# Patient Record
Sex: Female | Born: 1970 | Race: White | Hispanic: No | Marital: Married | Smoking: Never smoker
Health system: Southern US, Community
[De-identification: ages and names within clinical notes are randomized; demographics above are authoritative.]

## PROBLEM LIST (undated history)

## (undated) DIAGNOSIS — T4145XA Adverse effect of unspecified anesthetic, initial encounter: Secondary | ICD-10-CM

## (undated) DIAGNOSIS — J302 Other seasonal allergic rhinitis: Secondary | ICD-10-CM

## (undated) DIAGNOSIS — R51 Headache: Secondary | ICD-10-CM

## (undated) DIAGNOSIS — R519 Headache, unspecified: Secondary | ICD-10-CM

## (undated) DIAGNOSIS — Z87442 Personal history of urinary calculi: Secondary | ICD-10-CM

## (undated) DIAGNOSIS — R112 Nausea with vomiting, unspecified: Secondary | ICD-10-CM

## (undated) DIAGNOSIS — Z9889 Other specified postprocedural states: Secondary | ICD-10-CM

## (undated) DIAGNOSIS — I10 Essential (primary) hypertension: Secondary | ICD-10-CM

## (undated) DIAGNOSIS — T8859XA Other complications of anesthesia, initial encounter: Secondary | ICD-10-CM

---

## 2006-11-27 HISTORY — PX: ANKLE RECONSTRUCTION: SHX1151

## 2008-11-27 HISTORY — PX: TONSILLECTOMY: SUR1361

## 2011-11-28 HISTORY — PX: OTHER SURGICAL HISTORY: SHX169

## 2012-11-27 HISTORY — PX: KNEE ARTHROSCOPY: SUR90

## 2015-01-15 ENCOUNTER — Encounter (HOSPITAL_COMMUNITY): Payer: Self-pay | Admitting: Emergency Medicine

## 2015-01-15 ENCOUNTER — Emergency Department (HOSPITAL_COMMUNITY): Payer: BLUE CROSS/BLUE SHIELD

## 2015-01-15 ENCOUNTER — Emergency Department (HOSPITAL_COMMUNITY)
Admission: EM | Admit: 2015-01-15 | Discharge: 2015-01-16 | Disposition: A | Discharge status: Home / Self Care | Payer: BLUE CROSS/BLUE SHIELD | Attending: Emergency Medicine | Admitting: Emergency Medicine

## 2015-01-15 DIAGNOSIS — Z793 Long term (current) use of hormonal contraceptives: Secondary | ICD-10-CM | POA: Insufficient documentation

## 2015-01-15 DIAGNOSIS — N200 Calculus of kidney: Secondary | ICD-10-CM | POA: Diagnosis not present

## 2015-01-15 DIAGNOSIS — I10 Essential (primary) hypertension: Secondary | ICD-10-CM | POA: Insufficient documentation

## 2015-01-15 DIAGNOSIS — Z8742 Personal history of other diseases of the female genital tract: Secondary | ICD-10-CM | POA: Insufficient documentation

## 2015-01-15 DIAGNOSIS — Z79899 Other long term (current) drug therapy: Secondary | ICD-10-CM | POA: Insufficient documentation

## 2015-01-15 DIAGNOSIS — R109 Unspecified abdominal pain: Secondary | ICD-10-CM | POA: Diagnosis present

## 2015-01-15 HISTORY — DX: Essential (primary) hypertension: I10

## 2015-01-15 HISTORY — DX: Other seasonal allergic rhinitis: J30.2

## 2015-01-15 LAB — LIPASE, BLOOD: LIPASE: 31 U/L (ref 11–59)

## 2015-01-15 LAB — COMPREHENSIVE METABOLIC PANEL
ALK PHOS: 57 U/L (ref 39–117)
ALT: 12 U/L (ref 0–35)
ANION GAP: 13 (ref 5–15)
AST: 18 U/L (ref 0–37)
Albumin: 3.6 g/dL (ref 3.5–5.2)
BUN: 8 mg/dL (ref 6–23)
CO2: 21 mmol/L (ref 19–32)
Calcium: 9.3 mg/dL (ref 8.4–10.5)
Chloride: 102 mmol/L (ref 96–112)
Creatinine, Ser: 0.96 mg/dL (ref 0.50–1.10)
GFR calc Af Amer: 84 mL/min — ABNORMAL LOW (ref >90)
GFR calc non Af Amer: 72 mL/min — ABNORMAL LOW (ref >90)
Glucose, Bld: 132 mg/dL — ABNORMAL HIGH (ref 70–99)
POTASSIUM: 3.8 mmol/L (ref 3.5–5.1)
SODIUM: 136 mmol/L (ref 135–145)
TOTAL PROTEIN: 7.4 g/dL (ref 6.0–8.3)
Total Bilirubin: 0.2 mg/dL — ABNORMAL LOW (ref 0.3–1.2)

## 2015-01-15 LAB — URINALYSIS, ROUTINE W REFLEX MICROSCOPIC
Bilirubin Urine: NEGATIVE
Glucose, UA: NEGATIVE mg/dL
Ketones, ur: 15 mg/dL — AB
LEUKOCYTES UA: NEGATIVE
NITRITE: NEGATIVE
PROTEIN: NEGATIVE mg/dL
Specific Gravity, Urine: 1.024 (ref 1.005–1.030)
UROBILINOGEN UA: 0.2 mg/dL (ref 0.0–1.0)
pH: 5 (ref 5.0–8.0)

## 2015-01-15 LAB — CBC WITH DIFFERENTIAL/PLATELET
BASOS ABS: 0 10*3/uL (ref 0.0–0.1)
Basophils Relative: 0 % (ref 0–1)
Eosinophils Absolute: 0.2 10*3/uL (ref 0.0–0.7)
Eosinophils Relative: 2 % (ref 0–5)
HCT: 40.5 % (ref 36.0–46.0)
HEMOGLOBIN: 13.7 g/dL (ref 12.0–15.0)
Lymphocytes Relative: 25 % (ref 12–46)
Lymphs Abs: 2.4 10*3/uL (ref 0.7–4.0)
MCH: 28.6 pg (ref 26.0–34.0)
MCHC: 33.8 g/dL (ref 30.0–36.0)
MCV: 84.6 fL (ref 78.0–100.0)
Monocytes Absolute: 0.5 10*3/uL (ref 0.1–1.0)
Monocytes Relative: 5 % (ref 3–12)
NEUTROS ABS: 6.5 10*3/uL (ref 1.7–7.7)
NEUTROS PCT: 68 % (ref 43–77)
Platelets: 303 10*3/uL (ref 150–400)
RBC: 4.79 MIL/uL (ref 3.87–5.11)
RDW: 12.7 % (ref 11.5–15.5)
WBC: 9.6 10*3/uL (ref 4.0–10.5)

## 2015-01-15 LAB — URINE MICROSCOPIC-ADD ON

## 2015-01-15 LAB — PREGNANCY, URINE: PREG TEST UR: NEGATIVE

## 2015-01-15 LAB — POC URINE PREG, ED: PREG TEST UR: NEGATIVE

## 2015-01-15 MED ORDER — SODIUM CHLORIDE 0.9 % IV SOLN
Freq: Once | INTRAVENOUS | Status: AC
  Administered 2015-01-15: 21:00:00 via INTRAVENOUS

## 2015-01-15 MED ORDER — HYDROMORPHONE HCL 1 MG/ML IJ SOLN
1.0000 mg | Freq: Once | INTRAMUSCULAR | Status: AC
  Administered 2015-01-15: 1 mg via INTRAVENOUS
  Filled 2015-01-15: qty 1

## 2015-01-15 MED ORDER — OXYCODONE-ACETAMINOPHEN 5-325 MG PO TABS: 2.0000 | ORAL_TABLET | ORAL | Status: DC | PRN

## 2015-01-15 MED ORDER — KETOROLAC TROMETHAMINE 30 MG/ML IJ SOLN
30.0000 mg | Freq: Once | INTRAMUSCULAR | Status: AC
  Administered 2015-01-15: 30 mg via INTRAVENOUS
  Filled 2015-01-15: qty 1

## 2015-01-15 MED ORDER — LORAZEPAM 2 MG/ML IJ SOLN
1.0000 mg | Freq: Once | INTRAMUSCULAR | Status: AC
  Administered 2015-01-15: 1 mg via INTRAVENOUS
  Filled 2015-01-15: qty 1

## 2015-01-15 MED ORDER — ONDANSETRON HCL 4 MG/2ML IJ SOLN
4.0000 mg | Freq: Once | INTRAMUSCULAR | Status: AC
  Administered 2015-01-15: 4 mg via INTRAVENOUS
  Filled 2015-01-15: qty 2

## 2015-01-15 MED ORDER — IOHEXOL 300 MG/ML  SOLN
25.0000 mL | Freq: Once | INTRAMUSCULAR | Status: AC | PRN
  Administered 2015-01-15: 25 mL via ORAL

## 2015-01-15 MED ORDER — MORPHINE SULFATE 4 MG/ML IJ SOLN
4.0000 mg | Freq: Once | INTRAMUSCULAR | Status: AC
Start: 2015-01-15 — End: 2015-01-15
  Administered 2015-01-15: 4 mg via INTRAVENOUS
  Filled 2015-01-15: qty 1

## 2015-01-15 NOTE — ED Notes (Addendum)
C/o severe RLQ pain/side pain since 6:45pm.  Denies nausea, vomiting, diarrhea.  C/o dysuria. Last BM this morning- WNL.

## 2015-01-15 NOTE — ED Notes (Signed)
Pt c/o low back pain radiating into Right quadrant starting at 1900. Reports eating dinner then felt a sharp, sudden shooting pain in right flank. Pt diaphoretic.

## 2015-01-15 NOTE — ED Provider Notes (Signed)
CSN: [id number]     Arrival date & time 01/15/15  2013 History   First MD Initiated Contact with Patient 01/15/15 2034     Chief Complaint  Patient presents with  . Abdominal Pain     (Consider location/radiation/quality/duration/timing/severity/associated sxs/prior Treatment) HPI Comments: Patient is a 44 year old female with a past medical history of hypertension and seasonal allergies who presents with abdominal for the past 2 hours. The pain is located in the RUQ and right mid back and does not radiate. The pain is described as aching and severe. The pain started gradually and progressively worsened since the onset. No alleviating/aggravating factors. The patient has tried nothing for symptoms without relief. Associated symptoms include nothing. Patient denies fever, headache, NVD, chest pain, SOB, dysuria, constipation, abnormal vaginal bleeding/discharge. Patient reports history of ovarian cyst removal but no other abdominal surgical history.      Past Medical History  Diagnosis Date  . Hypertension   . Seasonal allergies    History reviewed. No pertinent past surgical history. No family history on file. History  Substance Use Topics  . Smoking status: Never Smoker   . Smokeless tobacco: Not on file  . Alcohol Use: No   OB History    No data available     Review of Systems  Constitutional: Negative for fever, chills and fatigue.  HENT: Negative for trouble swallowing.   Eyes: Negative for visual disturbance.  Respiratory: Negative for shortness of breath.   Cardiovascular: Negative for chest pain and palpitations.  Gastrointestinal: Positive for abdominal pain. Negative for nausea, vomiting and diarrhea.  Genitourinary: Negative for dysuria and difficulty urinating.  Musculoskeletal: Negative for arthralgias and neck pain.  Skin: Negative for color change.  Neurological: Negative for dizziness and weakness.  Psychiatric/Behavioral: Negative for dysphoric mood.       Allergies  Naproxen  Home Medications   Prior to Admission medications   Medication Sig Start Date End Date Taking? Authorizing Provider  cetirizine (ZYRTEC) 10 MG tablet Take 10 mg by mouth daily.   Yes Historical Provider, MD  Levonorgestrel-Ethinyl Estradiol (AMETHIA,CAMRESE) 0.15-0.03 &0.01 MG tablet Take 1 tablet by mouth daily.   Yes Historical Provider, MD  lisinopril (PRINIVIL,ZESTRIL) 5 MG tablet Take 5 mg by mouth daily.   Yes Historical Provider, MD  zolpidem (AMBIEN) 10 MG tablet Take 10 mg by mouth at bedtime as needed for sleep.   Yes Historical Provider, MD   BP 124/56 mmHg  Pulse 88  Temp(Src) 98.4 F (36.9 C) (Oral)  Resp 14  SpO2 97%  LMP 01/13/2015 Physical Exam  Constitutional: She is oriented to person, place, and time. She appears well-developed and well-nourished. No distress.  HENT:  Head: Normocephalic and atraumatic.  Eyes: Conjunctivae and EOM are normal.  Neck: Normal range of motion.  Cardiovascular: Normal rate and regular rhythm.  Exam reveals no gallop and no friction rub.   No murmur heard. Pulmonary/Chest: Effort normal and breath sounds normal. She has no wheezes. She has no rales. She exhibits no tenderness.  Abdominal: Soft. She exhibits no distension. There is tenderness. There is no rebound.  Obese abdomen. RUQ tenderness to palpation. No other focal tenderness or peritoneal signs.   Musculoskeletal: Normal range of motion.  Neurological: She is alert and oriented to person, place, and time. Coordination normal.  Speech is goal-oriented. Moves limbs without ataxia.   Skin: Skin is warm and dry.  Psychiatric: She has a normal mood and affect. Her behavior is normal.  Nursing note  and vitals reviewed.   ED Course  Procedures (including critical care time) Labs Review Labs Reviewed  COMPREHENSIVE METABOLIC PANEL - Abnormal; Notable for the following:    Glucose, Bld 132 (*)    Total Bilirubin 0.2 (*)    GFR calc non Af Amer 72  (*)    GFR calc Af Amer 84 (*)    All other components within normal limits  URINALYSIS, ROUTINE W REFLEX MICROSCOPIC - Abnormal; Notable for the following:    Hgb urine dipstick MODERATE (*)    Ketones, ur 15 (*)    All other components within normal limits  CBC WITH DIFFERENTIAL/PLATELET  LIPASE, BLOOD  PREGNANCY, URINE  URINE MICROSCOPIC-ADD ON  POC URINE PREG, ED    Imaging Review Ct Abdomen Pelvis Wo Contrast  01/15/2015   CLINICAL DATA:  Pt states Rt Flank pain -hx stones, -hematuria C/o severe RLQ pain/side pain since 6:45pm. Denies nausea, vomiting, diarrhea. C/o dysuria. Last BM this morning- WNL  EXAM: CT ABDOMEN AND PELVIS WITHOUT CONTRAST  TECHNIQUE: Multidetector CT imaging of the abdomen and pelvis was performed following the standard protocol without IV contrast.  COMPARISON:  None.  FINDINGS: Right kidney is edematous with mild right hydronephrosis and mild perinephric stranding. Right ureter is mildly dilated distally to where it intersects a 2-3 mm stone, 1 cm above the ureterovesicular junction.  No intrarenal stones. No renal masses. Left renal collecting system ureter are normal. No bladder mass or stone.  Lung bases essentially clear.  Heart normal in size.  Liver, spleen, gallbladder, pancreas, adrenal glands:  Normal.  Normal uterus and adnexa.  Mildly enlarged right inguinal lymph nodes, largest just deep to the common femoral vessels measuring 13 mm in short axis. No other adenopathy. No ascites.  There are few sigmoid colon diverticula. No diverticulitis. Remainder of the colon is unremarkable. Normal small bowel. Normal appendix.  Mild degenerative changes of the lower lumbar spine. No osteoblastic or osteolytic lesions.  IMPRESSION: 1. 2-3 mm stone in the distal right ureter causes mild right hydroureteronephrosis. 2. No other acute findings. 3. No intrarenal stones.   Electronically Signed   By: Amie Portlandavid  Ormond M.D.   On: 01/15/2015 23:31     EKG Interpretation None       MDM   Final diagnoses:  Kidney stone on right side    9:14 PM Labs and urinalysis pending. Vitals stable and patient afebrile. Patient will have morphine and zofran for symptoms.   11:44 PM Labs unremarkable for acute changes. Urinalysis shows moderate hemoglobin. CT shows 2-3 mm kidney stone in the distal ureter. Patient reports improvement of symptoms. Patient will have pain medication and be discharged with PCP follow up. Vitals stable and patient afebrile.   Emilia BeckKaitlyn Augustina Braddock, PA-C 01/15/15 2348  Mirian MoMatthew Gentry, MD 01/17/15 [phone]

## 2015-01-15 NOTE — Discharge Instructions (Signed)
Take Percocet as needed for pain. Refer to attached documents for more information. Return to the ED with worsening or concerning symptoms.  °

## 2015-01-26 ENCOUNTER — Other Ambulatory Visit: Payer: Self-pay | Admitting: Urology

## 2015-01-26 ENCOUNTER — Encounter (HOSPITAL_COMMUNITY): Payer: Self-pay | Admitting: *Deleted

## 2015-01-26 DIAGNOSIS — Z87442 Personal history of urinary calculi: Secondary | ICD-10-CM

## 2015-01-26 HISTORY — DX: Personal history of urinary calculi: Z87.442

## 2015-01-28 ENCOUNTER — Ambulatory Visit (HOSPITAL_COMMUNITY): Payer: BLUE CROSS/BLUE SHIELD

## 2015-01-28 ENCOUNTER — Encounter (HOSPITAL_COMMUNITY)
Admission: RE | Disposition: A | Discharge status: Home / Self Care | Payer: Self-pay | Source: Ambulatory Visit | Attending: Urology

## 2015-01-28 ENCOUNTER — Ambulatory Visit (HOSPITAL_COMMUNITY)
Admission: RE | Admit: 2015-01-28 | Discharge: 2015-01-28 | Disposition: A | Discharge status: Home / Self Care | Payer: BLUE CROSS/BLUE SHIELD | Source: Ambulatory Visit | Attending: Urology | Admitting: Urology

## 2015-01-28 ENCOUNTER — Encounter (HOSPITAL_COMMUNITY): Payer: Self-pay | Admitting: *Deleted

## 2015-01-28 DIAGNOSIS — I10 Essential (primary) hypertension: Secondary | ICD-10-CM | POA: Diagnosis not present

## 2015-01-28 DIAGNOSIS — N201 Calculus of ureter: Secondary | ICD-10-CM | POA: Diagnosis not present

## 2015-01-28 DIAGNOSIS — Z886 Allergy status to analgesic agent status: Secondary | ICD-10-CM | POA: Insufficient documentation

## 2015-01-28 HISTORY — DX: Personal history of urinary calculi: Z87.442

## 2015-01-28 HISTORY — DX: Adverse effect of unspecified anesthetic, initial encounter: T41.45XA

## 2015-01-28 HISTORY — DX: Headache, unspecified: R51.9

## 2015-01-28 HISTORY — DX: Other complications of anesthesia, initial encounter: T88.59XA

## 2015-01-28 HISTORY — DX: Nausea with vomiting, unspecified: Z98.890

## 2015-01-28 HISTORY — DX: Headache: R51

## 2015-01-28 HISTORY — DX: Nausea with vomiting, unspecified: R11.2

## 2015-01-28 LAB — PREGNANCY, URINE: Preg Test, Ur: NEGATIVE

## 2015-01-28 SURGERY — LITHOTRIPSY, ESWL
Anesthesia: LOCAL | Laterality: Right

## 2015-01-28 MED ORDER — GENTAMICIN SULFATE 40 MG/ML IJ SOLN
400.0000 mg | Freq: Once | INTRAMUSCULAR | Status: AC
  Administered 2015-01-28: 400 mg via INTRAVENOUS
  Filled 2015-01-28: qty 10

## 2015-01-28 MED ORDER — GENTAMICIN IN SALINE 1.6-0.9 MG/ML-% IV SOLN: 80.0000 mg | INTRAVENOUS | Status: DC

## 2015-01-28 MED ORDER — DIAZEPAM 5 MG PO TABS
10.0000 mg | ORAL_TABLET | ORAL | Status: AC
  Administered 2015-01-28: 10 mg via ORAL
  Filled 2015-01-28: qty 2

## 2015-01-28 MED ORDER — SODIUM CHLORIDE 0.9 % IV SOLN
INTRAVENOUS | Status: DC
  Administered 2015-01-28: 12:00:00 via INTRAVENOUS

## 2015-01-28 MED ORDER — DIPHENHYDRAMINE HCL 25 MG PO CAPS
25.0000 mg | ORAL_CAPSULE | ORAL | Status: AC
  Administered 2015-01-28: 25 mg via ORAL
  Filled 2015-01-28: qty 1

## 2015-01-28 NOTE — H&P (Signed)
Nicole Pena is an 44 y.o. female.    Chief Complaint: Pre-Op Right Shockwave LIthotripsy  HPI:   1 - Rt Ureteral Stone - Rt 3mm distal ureteral stone with mod hydro by ER CT 12/2014. No additional stones. No infectious parameters by ER UA. Stone 300 HU, SSD 14cm, located at level of med femoral head just above pelvic brim on scout images. Cr <1. Given trial of medical therapy and reports no interval passage. Stone is visible by KUB.  PMH sig for oophorectomy, ortho . Her PCP is Delaine LameJanice Dickerson [place] in [place].   Today "Nicole Pena" is seen to proceed with shockwave lithotripsy. NO interva fevers or stone passage.    Past Medical History  Diagnosis Date  . Hypertension   . Seasonal allergies   . History of kidney stones 01/2015  . Headache     history of migraines  . Complication of anesthesia     blood pressure dropped during ankle surgery  . PONV (postoperative nausea and vomiting)     treated with zofran    Past Surgical History  Procedure Laterality Date  . Left ovary removed Left 2013  . Tonsillectomy  2010  . Knee arthroscopy Right 2014  . Ankle reconstruction Left 2008    History reviewed. No pertinent family history. Social History:  reports that she has never smoked. She does not have any smokeless tobacco history on file. She reports that she does not drink alcohol or use illicit drugs.  Allergies:  Allergies  Allergen Reactions  . Naproxen Other (See Comments)    hallucinations    Medications Prior to Admission  Medication Sig Dispense Refill  . cetirizine (ZYRTEC) 10 MG tablet Take 10 mg by mouth daily.    . Levonorgestrel-Ethinyl Estradiol (AMETHIA,CAMRESE) 0.15-0.03 &0.01 MG tablet Take 1 tablet by mouth daily.    Marland Kitchen. lisinopril (PRINIVIL,ZESTRIL) 5 MG tablet Take 5 mg by mouth at bedtime.     Marland Kitchen. oxyCODONE-acetaminophen (PERCOCET/ROXICET) 5-325 MG per tablet Take 2 tablets by mouth every 4 (four) hours as needed for moderate pain or severe pain. 10 tablet 0  .  zolpidem (AMBIEN) 10 MG tablet Take 10 mg by mouth at bedtime as needed for sleep.      Results for orders placed or performed during the hospital encounter of 01/28/15 (from the past 48 hour(s))  Pregnancy, urine     Status: None   Collection Time: 01/28/15 11:44 AM  Result Value Ref Range   Preg Test, Ur NEGATIVE NEGATIVE    Comment:        THE SENSITIVITY OF THIS METHODOLOGY IS >20 mIU/mL.    Dg Abd 1 View  01/28/2015   CLINICAL DATA:  Right lower quadrant tenderness. Distal right ureteral calculus  EXAM: ABDOMEN - 1 VIEW  COMPARISON:  CT abdomen and pelvis January 15, 2015; abdominal radiograph January 26, 2015  FINDINGS: There is a 1 mm calcification in the lower right pelvis, unchanged. A 1 mm calcification is also noted in the lower left pelvis, stable. There are no new calcifications. There is moderate stool throughout the colon. The bowel gas pattern is unremarkable.  IMPRESSION: Small pelvic calcifications are stable. No new calcifications. Stool in colon. Bowel gas pattern unremarkable.   Electronically Signed   By: Bretta BangWilliam  Woodruff III M.D.   On: 01/28/2015 12:15    Review of Systems  Constitutional: Negative.   HENT: Negative.   Eyes: Negative.   Respiratory: Negative.   Cardiovascular: Negative.   Gastrointestinal: Negative.  Genitourinary: Positive for dysuria and flank pain.  Skin: Negative.   Neurological: Negative.   Endo/Heme/Allergies: Negative.   Psychiatric/Behavioral: Negative.     Blood pressure 139/78, pulse 86, temperature 98.6 F (37 C), temperature source Oral, resp. rate 16, height  (1.676 m), weight 121.292 kg (267 lb 6.4 oz), last menstrual period 01/18/2015, SpO2 100 %. Physical Exam  Constitutional: She appears well-developed.  HENT:  Head: Normocephalic.  Eyes: Pupils are equal, round, and reactive to light.  Neck: Normal range of motion.  Cardiovascular: Normal rate.   Respiratory: Effort normal.  GI: Soft.  Genitourinary:  Mild Rt   CVAT  Musculoskeletal: Normal range of motion.  Neurological: She is alert.  Skin: Skin is warm.  Psychiatric: She has a normal mood and affect. Her behavior is normal. Judgment and thought content normal.     Assessment/Plan    1 - Rt Ureteral Stone -   We rediscussed shockwave lithotripsy in detail as well as my "rule of 9s" with stones <46mm, less than 900 HU, and skin to stone distance <9cm having approximately 90% treatment success with single session of treatment. We then readdressed how stones that are larger, more dense, and in patients with less favorable anatomy have incrementally decreased success rates. We rediscussed risks including, bleeding, infection, hematoma, loss of kidney, need for staged therapy, need for adjunctive therapy and requirement to refrain from any anticoagulants, anti-platelet or aspirin-like products peri-procedureally. After careful consideration, the patient has chosen to proceed today as planned.     Kylin Dubs 01/28/2015, 12:28 PM

## 2015-01-28 NOTE — Discharge Instructions (Signed)
1 - You may have urinary urgency (bladder spasms) , bloody urine on / off, and pass stone material for few days. This is normal.  2 - Call MD or go to ER for fever >102, severe pain / nausea / vomiting not relieved by medications, or acute change in medical status

## 2015-01-28 NOTE — Brief Op Note (Signed)
01/28/2015  2:55 PM  PATIENT:  Vernie AmmonsNicole L Carboni  44 y.o. female  PRE-OPERATIVE DIAGNOSIS:  RIGHT URETERAL STONE  POST-OPERATIVE DIAGNOSIS:  * No post-op diagnosis entered *  PROCEDURE:  Procedure(s): RIGHT EXTRACORPOREAL SHOCK WAVE LITHOTRIPSY (ESWL) (Right)  SURGEON:  Surgeon(s) and Role:    * Sebastian Acheheodore Wreatha Sturgeon, MD - Primary  PHYSICIAN ASSISTANT:   ASSISTANTS: none   ANESTHESIA:   MAC  EBL:     BLOOD ADMINISTERED:none  DRAINS: none   LOCAL MEDICATIONS USED:  NONE  SPECIMEN:  No Specimen  DISPOSITION OF SPECIMEN:  N/A  COUNTS:  YES  TOURNIQUET:  * No tourniquets in log *  DICTATION: .Note written in paper chart  PLAN OF CARE: Discharge to home after PACU  PATIENT DISPOSITION:  PACU - hemodynamically stable.   Delay start of Pharmacological VTE agent (>24hrs) due to surgical blood loss or risk of bleeding: not applicable

## 2015-07-01 ENCOUNTER — Emergency Department (HOSPITAL_COMMUNITY): Payer: BLUE CROSS/BLUE SHIELD

## 2015-07-01 ENCOUNTER — Encounter (HOSPITAL_COMMUNITY): Payer: Self-pay | Admitting: Emergency Medicine

## 2015-07-01 ENCOUNTER — Emergency Department (HOSPITAL_COMMUNITY)
Admission: EM | Admit: 2015-07-01 | Discharge: 2015-07-01 | Disposition: A | Discharge status: Home / Self Care | Payer: BLUE CROSS/BLUE SHIELD | Attending: Emergency Medicine | Admitting: Emergency Medicine

## 2015-07-01 DIAGNOSIS — R0602 Shortness of breath: Secondary | ICD-10-CM | POA: Diagnosis not present

## 2015-07-01 DIAGNOSIS — Z79899 Other long term (current) drug therapy: Secondary | ICD-10-CM | POA: Diagnosis not present

## 2015-07-01 DIAGNOSIS — R1012 Left upper quadrant pain: Secondary | ICD-10-CM | POA: Insufficient documentation

## 2015-07-01 DIAGNOSIS — Z87442 Personal history of urinary calculi: Secondary | ICD-10-CM | POA: Diagnosis not present

## 2015-07-01 DIAGNOSIS — Z79818 Long term (current) use of other agents affecting estrogen receptors and estrogen levels: Secondary | ICD-10-CM | POA: Insufficient documentation

## 2015-07-01 DIAGNOSIS — R079 Chest pain, unspecified: Secondary | ICD-10-CM | POA: Insufficient documentation

## 2015-07-01 DIAGNOSIS — I10 Essential (primary) hypertension: Secondary | ICD-10-CM | POA: Diagnosis not present

## 2015-07-01 LAB — CBC WITH DIFFERENTIAL/PLATELET
BASOS ABS: 0 10*3/uL (ref 0.0–0.1)
Basophils Relative: 0 % (ref 0–1)
Eosinophils Absolute: 0.2 10*3/uL (ref 0.0–0.7)
Eosinophils Relative: 3 % (ref 0–5)
HCT: 40.4 % (ref 36.0–46.0)
Hemoglobin: 13.4 g/dL (ref 12.0–15.0)
Lymphocytes Relative: 33 % (ref 12–46)
Lymphs Abs: 2.2 10*3/uL (ref 0.7–4.0)
MCH: 28.8 pg (ref 26.0–34.0)
MCHC: 33.2 g/dL (ref 30.0–36.0)
MCV: 86.9 fL (ref 78.0–100.0)
Monocytes Absolute: 0.4 10*3/uL (ref 0.1–1.0)
Monocytes Relative: 6 % (ref 3–12)
Neutro Abs: 3.9 10*3/uL (ref 1.7–7.7)
Neutrophils Relative %: 58 % (ref 43–77)
PLATELETS: 303 10*3/uL (ref 150–400)
RBC: 4.65 MIL/uL (ref 3.87–5.11)
RDW: 12.9 % (ref 11.5–15.5)
WBC: 6.7 10*3/uL (ref 4.0–10.5)

## 2015-07-01 LAB — URINALYSIS, ROUTINE W REFLEX MICROSCOPIC
Glucose, UA: NEGATIVE mg/dL
Hgb urine dipstick: NEGATIVE
KETONES UR: NEGATIVE mg/dL
NITRITE: NEGATIVE
Protein, ur: NEGATIVE mg/dL
Specific Gravity, Urine: 1.039 — ABNORMAL HIGH (ref 1.005–1.030)
Urobilinogen, UA: 1 mg/dL (ref 0.0–1.0)
pH: 6 (ref 5.0–8.0)

## 2015-07-01 LAB — URINE MICROSCOPIC-ADD ON

## 2015-07-01 LAB — BASIC METABOLIC PANEL
ANION GAP: 7 (ref 5–15)
BUN: 12 mg/dL (ref 6–20)
CHLORIDE: 107 mmol/L (ref 101–111)
CO2: 24 mmol/L (ref 22–32)
CREATININE: 0.91 mg/dL (ref 0.44–1.00)
Calcium: 8.5 mg/dL — ABNORMAL LOW (ref 8.9–10.3)
Glucose, Bld: 114 mg/dL — ABNORMAL HIGH (ref 65–99)
Potassium: 3.6 mmol/L (ref 3.5–5.1)
SODIUM: 138 mmol/L (ref 135–145)

## 2015-07-01 LAB — I-STAT TROPONIN, ED
TROPONIN I, POC: 0 ng/mL (ref 0.00–0.08)
Troponin i, poc: 0 ng/mL (ref 0.00–0.08)

## 2015-07-01 LAB — LIPASE, BLOOD: LIPASE: 30 U/L (ref 22–51)

## 2015-07-01 LAB — D-DIMER, QUANTITATIVE (NOT AT ARMC)

## 2015-07-01 MED ORDER — MORPHINE SULFATE 4 MG/ML IJ SOLN
4.0000 mg | Freq: Once | INTRAMUSCULAR | Status: AC
  Administered 2015-07-01: 4 mg via INTRAVENOUS
  Filled 2015-07-01: qty 1

## 2015-07-01 MED ORDER — SODIUM CHLORIDE 0.9 % IV BOLUS (SEPSIS)
1000.0000 mL | Freq: Once | INTRAVENOUS | Status: AC
  Administered 2015-07-01: 1000 mL via INTRAVENOUS

## 2015-07-01 MED ORDER — ASPIRIN 81 MG PO CHEW
324.0000 mg | CHEWABLE_TABLET | Freq: Once | ORAL | Status: AC
  Administered 2015-07-01: 324 mg via ORAL
  Filled 2015-07-01: qty 4

## 2015-07-01 MED ORDER — ONDANSETRON HCL 4 MG/2ML IJ SOLN
4.0000 mg | Freq: Once | INTRAMUSCULAR | Status: AC
  Administered 2015-07-01: 4 mg via INTRAVENOUS
  Filled 2015-07-01: qty 2

## 2015-07-01 MED ORDER — GI COCKTAIL ~~LOC~~
30.0000 mL | Freq: Once | ORAL | Status: AC
  Administered 2015-07-01: 30 mL via ORAL
  Filled 2015-07-01: qty 30

## 2015-07-01 NOTE — Discharge Instructions (Signed)
Chest Pain (Nonspecific) Follow up with your primary care physician.  Return for chest pain, shortness of breath, and diaphoresis.  It is often hard to give a diagnosis for the cause of chest pain. There is always a chance that your pain could be related to something serious, such as a heart attack or a blood clot in the lungs. You need to follow up with your doctor. HOME CARE  If antibiotic medicine was given, take it as directed by your doctor. Finish the medicine even if you start to feel better.  For the next few days, avoid activities that bring on chest pain. Continue physical activities as told by your doctor.  Do not use any tobacco products. This includes cigarettes, chewing tobacco, and e-cigarettes.  Avoid drinking alcohol.  Only take medicine as told by your doctor.  Follow your doctor's suggestions for more testing if your chest pain does not go away.  Keep all doctor visits you made. GET HELP IF:  Your chest pain does not go away, even after treatment.  You have a rash with blisters on your chest.  You have a fever. GET HELP RIGHT AWAY IF:   You have more pain or pain that spreads to your arm, neck, jaw, back, or belly (abdomen).  You have shortness of breath.  You cough more than usual or cough up blood.  You have very bad back or belly pain.  You feel sick to your stomach (nauseous) or throw up (vomit).  You have very bad weakness.  You pass out (faint).  You have chills. This is an emergency. Do not wait to see if the problems will go away. Call your local emergency services (911 in [place] Do not drive yourself to the hospital. MAKE SURE YOU:   Understand these instructions.  Will watch your condition.  Will get help right away if you are not doing well or get worse. Document Released: 05/01/2008 Document Revised: 11/18/2013 Document Reviewed: 05/01/2008 [facility] Patient Information 2015 [place], [place]. This information is not intended to replace  advice given to you by your health care provider. Make sure you discuss any questions you have with your health care provider.

## 2015-07-01 NOTE — ED Notes (Signed)
Pt comes in today with a c/o chest pain. Pt states that she has been having some abdominal pain and yesterday the pain radiated into her chest. Pt states the pain is on the left side and mid section of her chest. Pt states the pain radiates to her shoulders.

## 2015-07-01 NOTE — ED Provider Notes (Signed)
CSN: [id number]     Arrival date & time 07/01/15  1706 History   First MD Initiated Contact with Patient 07/01/15 1711     Chief Complaint  Patient presents with  . Chest Pain     (Consider location/radiation/quality/duration/timing/severity/associated sxs/prior Treatment) The history is provided by the patient. No language interpreter was used.   Nicole Pena is a 44 year old female with a history of hypertension and kidney stones who presents for left upper quadrant abdominal pain for the past 3 weeks and left sided chest pain that began this morning. She states it is intermittent and feels like a "vice grip." She reports mild shortness of breath with the chest pain. She denies any radiation of the pain. Nothing makes her symptoms better or worse. Her treatment prior to arrival. She is on birth control pills. Family history includes mother having a heart attack over the age of 75. No recent surgical history. No prior history of DVT, PE, or previous heart stents or MI. She denies any smoking history. She denies any fever, chills, cough, recent illness, vomiting, diarrhea, constipation, dysuria, hematuria, urinary frequency.  Past Medical History  Diagnosis Date  . Hypertension   . Seasonal allergies   . History of kidney stones 01/2015  . Headache     history of migraines  . Complication of anesthesia     blood pressure dropped during ankle surgery  . PONV (postoperative nausea and vomiting)     treated with zofran   Past Surgical History  Procedure Laterality Date  . Left ovary removed Left 2013  . Tonsillectomy  2010  . Knee arthroscopy Right 2014  . Ankle reconstruction Left 2008   History reviewed. No pertinent family history. History  Substance Use Topics  . Smoking status: Never Smoker   . Smokeless tobacco: Not on file  . Alcohol Use: No   OB History    No data available     Review of Systems  Respiratory: Positive for shortness of breath.   Cardiovascular: Positive  for chest pain. Negative for leg swelling.  All other systems reviewed and are negative.     Allergies  Naproxen and Statins  Home Medications   Prior to Admission medications   Medication Sig Start Date End Date Taking? Authorizing Provider  cetirizine (ZYRTEC) 10 MG tablet Take 10 mg by mouth daily.   Yes Historical Provider, MD  Levonorgestrel-Ethinyl Estradiol (AMETHIA,CAMRESE) 0.15-0.03 &0.01 MG tablet Take 1 tablet by mouth daily.   Yes Historical Provider, MD  LORazepam (ATIVAN) 0.5 MG tablet Take 0.25 mg by mouth at bedtime as needed. sleep 06/09/15  Yes Historical Provider, MD  losartan (COZAAR) 50 MG tablet Take 50 mg by mouth at bedtime.   Yes Historical Provider, MD  zolpidem (AMBIEN) 10 MG tablet Take 10 mg by mouth at bedtime as needed for sleep.   Yes Historical Provider, MD   BP 138/53 mmHg  Pulse 72  Temp(Src) 98.1 F (36.7 C) (Oral)  Resp 15  Ht 5\' 5"  (1.651 m)  Wt 265 lb (120.203 kg)  BMI 44.10 kg/m2  SpO2 97%  LMP 04/26/2014 Physical Exam  Constitutional: She is oriented to person, place, and time. She appears well-developed and well-nourished. No distress.  HENT:  Head: Normocephalic and atraumatic.  Eyes: Conjunctivae are normal.  Neck: Normal range of motion. Neck supple.  Cardiovascular: Normal rate, regular rhythm and normal heart sounds.   Pulmonary/Chest: Effort normal and breath sounds normal. No accessory muscle usage. No respiratory distress. She has  no decreased breath sounds. She has no wheezes. She has no rales. She exhibits no tenderness.  Abdominal: Soft. She exhibits no distension and no mass. There is tenderness in the left upper quadrant. There is no rigidity, no rebound, no guarding, no tenderness at McBurney's point and negative Murphy's sign.  Morbidly obese.  Musculoskeletal: Normal range of motion. She exhibits no edema or tenderness.  No calf tenderness to palpation.  Neurological: She is alert and oriented to person, place, and  time.  Skin: Skin is warm and dry.  Psychiatric: She has a normal mood and affect. Her behavior is normal.  Nursing note and vitals reviewed.   ED Course  Procedures (including critical care time) Labs Review Labs Reviewed  BASIC METABOLIC PANEL - Abnormal; Notable for the following:    Glucose, Bld 114 (*)    Calcium 8.5 (*)    All other components within normal limits  URINALYSIS, ROUTINE W REFLEX MICROSCOPIC (NOT AT [facility]) - Abnormal; Notable for the following:    Color, Urine AMBER (*)    APPearance CLOUDY (*)    Specific Gravity, Urine 1.039 (*)    Bilirubin Urine SMALL (*)    Leukocytes, UA SMALL (*)    All other components within normal limits  URINE MICROSCOPIC-ADD ON - Abnormal; Notable for the following:    Bacteria, UA MANY (*)    All other components within normal limits  CBC WITH DIFFERENTIAL/PLATELET  LIPASE, BLOOD  D-DIMER, QUANTITATIVE (NOT AT [facility])  I-STAT TROPOININ, ED  Rosezena Sensor, ED    Imaging Review Dg Chest 2 View  07/01/2015   CLINICAL DATA:  Lateral chest pain  EXAM: CHEST  2 VIEW  COMPARISON:  None.  FINDINGS: The heart size and mediastinal contours are within normal limits. Both lungs are clear. The visualized skeletal structures are unremarkable.  IMPRESSION: No active cardiopulmonary disease.   Electronically Signed   By: Christiana Pellant M.D.   On: 07/01/2015 18:23     EKG Interpretation   Date/Time:  Thursday July 01 2015 17:19:26 EDT Ventricular Rate:  106 PR Interval:  153 QRS Duration: 89 QT Interval:  333 QTC Calculation: 442 R Axis:   48 Text Interpretation:  Sinus tachycardia Confirmed by Fayrene Fearing  MD, MARK  [phone]) on 07/01/2015 5:21:17 PM      MDM   Final diagnoses:  Chest pain, unspecified chest pain type  LUQ abdominal pain   Patient presents for chest pain that began this morning. She is also complaining of LUQ abdominal pain 3 weeks. Her vitals are stable and not concerning. Her labs are unremarkable and troponin is  negative. Chest x-ray is negative for any edema, pneumonia, or pneumothorax. Her EKG is also not concerning. She has a heart score of 1. D-dimer is negative.  Recheck: Patient just received aspirin and Zofran. She states that she has a history of Lyme's disease and mono and felt similar symptoms at that time. Recheck: She states the chest cramping has resolved but continues to have abdominal pain.  Will give GI cocktail.  Repeat troponin is negative. I discussed return precautions with the patient as well as following up with her PCP. She verbally agrees with the plan and were no unanswered concerns.      Catha Gosselin, PA-C 07/01/15 2226  Rolland Porter, MD 07/06/15 [phone]

## 2015-07-02 ENCOUNTER — Encounter: Payer: Self-pay | Admitting: Gastroenterology

## 2015-07-07 ENCOUNTER — Ambulatory Visit (INDEPENDENT_AMBULATORY_CARE_PROVIDER_SITE_OTHER): Payer: BLUE CROSS/BLUE SHIELD | Admitting: Family Medicine

## 2015-07-07 ENCOUNTER — Encounter: Payer: Self-pay | Admitting: Family Medicine

## 2015-07-07 DIAGNOSIS — I10 Essential (primary) hypertension: Secondary | ICD-10-CM | POA: Insufficient documentation

## 2015-07-07 DIAGNOSIS — G4700 Insomnia, unspecified: Secondary | ICD-10-CM | POA: Insufficient documentation

## 2015-07-07 DIAGNOSIS — J302 Other seasonal allergic rhinitis: Secondary | ICD-10-CM | POA: Insufficient documentation

## 2015-07-07 DIAGNOSIS — R1011 Right upper quadrant pain: Secondary | ICD-10-CM | POA: Diagnosis not present

## 2015-07-07 DIAGNOSIS — N83299 Other ovarian cyst, unspecified side: Secondary | ICD-10-CM | POA: Insufficient documentation

## 2015-07-07 NOTE — Progress Notes (Signed)
BP 128/84 mmHg  Pulse 76  Temp(Src) 99.4 F (37.4 C) (Oral)  Ht  (1.651 m)  Wt 266 lb 6.4 oz (120.838 kg)  BMI 44.33 kg/m2  LMP 04/06/2015 (Approximate)   Subjective:    Patient ID: Nicole Pena, female    DOB: Feb 05, 1971, 44 y.o.   MRN: [id number]  HPI: Nicole Pena is a 44 y.o. female presenting on 07/07/2015 for Establish Care; Abdominal Pain; and Nausea   HPI Patient presents today to establish care but also here for abdominal pain and nausea. She has been having this abdominal pain/nausea for a week to 2 weeks. He came on gradually but is increased. She has noted that it's worse with foods especially fatty and fried foods but it is also constant. She went to the emergency department on 07/01/2015 native some tests including a lipase, basic metabolic panel, CBC, chest x-ray, EKG and urinalysis and these were all normal. She has since that time been on a bland diet which is improved symptoms but not resolved. Pain today is 6 out of 10 but has been as high as 10 out of 10 and previously. Pertinent history is that she has had renal stones previously and she has also had ovarian cysts but this pain is not like that pain. She feels sharp stabbing sensations like something is punching her in the gut.  Relevant past medical, surgical, family and social history reviewed and updated as indicated. Interim medical history since our last visit reviewed. Allergies and medications reviewed and updated.  Review of Systems  Constitutional: Negative for fever and chills.  HENT: Negative for congestion and sore throat.   Eyes: Negative for redness and visual disturbance.  Respiratory: Positive for chest tightness (had developed chest tightness when pain was severe but that has resolved). Negative for cough, shortness of breath and wheezing.   Cardiovascular: Positive for chest pain (had chest pain one pain was severe but that part is resolved). Negative for palpitations.  Gastrointestinal:  Positive for nausea and abdominal pain. Negative for vomiting, diarrhea, constipation, blood in stool and abdominal distention.  Genitourinary: Negative for dysuria, hematuria, vaginal bleeding, vaginal discharge, difficulty urinating, vaginal pain and pelvic pain.  Musculoskeletal: Positive for back pain (ain does radiate around to her right and left flank).  Skin: Negative for color change and rash.  Allergic/Immunologic: Positive for environmental allergies.    Per HPI unless specifically indicated above     Medication List       This list is accurate as of: 07/07/15 12:08 PM.  Always use your most recent med list.               cetirizine 10 MG tablet  Commonly known as:  ZYRTEC  Take 10 mg by mouth daily.     Levonorgestrel-Ethinyl Estradiol 0.15-0.03 &0.01 MG tablet  Commonly known as:  AMETHIA,CAMRESE  Take 1 tablet by mouth daily.     LORazepam 0.5 MG tablet  Commonly known as:  ATIVAN  Take 0.25 mg by mouth at bedtime as needed. sleep     losartan 50 MG tablet  Commonly known as:  COZAAR  Take 50 mg by mouth at bedtime.     zolpidem 10 MG tablet  Commonly known as:  AMBIEN  Take 10 mg by mouth at bedtime as needed for sleep.           Objective:    BP 128/84 mmHg  Pulse 76  Temp(Src) 99.4 F (37.4 C) (Oral)  Ht 5\' 5"  (1.651 m)  Wt 266 lb 6.4 oz (120.838 kg)  BMI 44.33 kg/m2  LMP 04/06/2015 (Approximate)  Wt Readings from Last 3 Encounters:  07/07/15 266 lb 6.4 oz (120.838 kg)  07/01/15 265 lb (120.203 kg)  01/28/15 267 lb 6.4 oz (121.292 kg)    Physical Exam  Constitutional: She is oriented to person, place, and time. She appears well-developed and well-nourished. No distress.  Obese  Eyes: Conjunctivae and EOM are normal. Pupils are equal, round, and reactive to light.  Cardiovascular: Normal rate and regular rhythm.   No murmur heard. Pulmonary/Chest: Effort normal and breath sounds normal. No respiratory distress. She has no wheezes.    Abdominal: Soft. Bowel sounds are normal. She exhibits no distension, no fluid wave and no ascites. There is no hepatosplenomegaly. There is tenderness (worse in epigastrium and right upper quadrant but does have some tenderness in left upper quadrant and left lower quadrant as well). There is no rebound, no guarding and no CVA tenderness.  Musculoskeletal: Normal range of motion. She exhibits no edema or tenderness.  Neurological: She is alert and oriented to person, place, and time. Coordination normal.  Skin: Skin is warm and dry. No rash noted. She is not diaphoretic.  Psychiatric: She has a normal mood and affect. Her behavior is normal.  Vitals reviewed.   Results for orders placed or performed during the hospital encounter of 07/01/15  CBC with Differential  Result Value Ref Range   WBC 6.7 4.0 - 10.5 K/uL   RBC 4.65 3.87 - 5.11 MIL/uL   Hemoglobin 13.4 12.0 - 15.0 g/dL   HCT 11.9 14.7 - 82.9 %   MCV 86.9 78.0 - 100.0 fL   MCH 28.8 26.0 - 34.0 pg   MCHC 33.2 30.0 - 36.0 g/dL   RDW 56.2 13.0 - 86.5 %   Platelets 303 150 - 400 K/uL   Neutrophils Relative % 58 43 - 77 %   Neutro Abs 3.9 1.7 - 7.7 K/uL   Lymphocytes Relative 33 12 - 46 %   Lymphs Abs 2.2 0.7 - 4.0 K/uL   Monocytes Relative 6 3 - 12 %   Monocytes Absolute 0.4 0.1 - 1.0 K/uL   Eosinophils Relative 3 0 - 5 %   Eosinophils Absolute 0.2 0.0 - 0.7 K/uL   Basophils Relative 0 0 - 1 %   Basophils Absolute 0.0 0.0 - 0.1 K/uL  Lipase, blood  Result Value Ref Range   Lipase 30 22 - 51 U/L  Basic metabolic panel  Result Value Ref Range   Sodium 138 135 - 145 mmol/L   Potassium 3.6 3.5 - 5.1 mmol/L   Chloride 107 101 - 111 mmol/L   CO2 24 22 - 32 mmol/L   Glucose, Bld 114 (H) 65 - 99 mg/dL   BUN 12 6 - 20 mg/dL   Creatinine, Ser 7.84 0.44 - 1.00 mg/dL   Calcium 8.5 (L) 8.9 - 10.3 mg/dL   GFR calc non Af Amer >60 >60 mL/min   GFR calc Af Amer >60 >60 mL/min   Anion gap 7 5 - 15  D-dimer, quantitative (not at  [facility])  Result Value Ref Range   D-Dimer, Quant <0.27 0.00 - 0.48 ug/mL-FEU  Urinalysis, Routine w reflex microscopic (not at [facility])  Result Value Ref Range   Color, Urine AMBER (A) YELLOW   APPearance CLOUDY (A) CLEAR   Specific Gravity, Urine 1.039 (H) 1.005 - 1.030   pH 6.0 5.0 - 8.0  Glucose, UA NEGATIVE NEGATIVE mg/dL   Hgb urine dipstick NEGATIVE NEGATIVE   Bilirubin Urine SMALL (A) NEGATIVE   Ketones, ur NEGATIVE NEGATIVE mg/dL   Protein, ur NEGATIVE NEGATIVE mg/dL   Urobilinogen, UA 1.0 0.0 - 1.0 mg/dL   Nitrite NEGATIVE NEGATIVE   Leukocytes, UA SMALL (A) NEGATIVE  Urine microscopic-add on  Result Value Ref Range   Squamous Epithelial / LPF RARE RARE   WBC, UA 0-2 <3 WBC/hpf   Bacteria, UA MANY (A) RARE   Urine-Other MUCOUS PRESENT   I-Stat Troponin, ED (not at [facility])  Result Value Ref Range   Troponin i, poc 0.00 0.00 - 0.08 ng/mL   Comment 3          I-Stat Troponin, ED (not at [facility])  Result Value Ref Range   Troponin i, poc 0.00 0.00 - 0.08 ng/mL   Comment 3              Assessment & Plan:   Problem List Items Addressed This Visit      Other   Abdominal pain, right upper quadrant    Pt here for new RUQ abd pain of 1-2 weeks. Worse with food, lipase at ED along with labs were normal. Will get RUQ U/s, do CT abd if it is negative. In ER patient also had other labs which were all normal including a UA and chem panel.      Relevant Orders   US Abdomen Limited RUQ       Follow up plan: Return in about 1 week (around 07/14/2015), or if symptoms worsen or fail to improve.  Arville Care, MD [facility] 07/07/2015, 12:08 PM

## 2015-07-07 NOTE — Patient Instructions (Signed)
Cholecystitis Cholecystitis is an inflammation of your gallbladder. It is usually caused by a buildup of gallstones or sludge (cholelithiasis) in your gallbladder. The gallbladder stores a fluid that helps digest fats (bile). Cholecystitis is serious and needs treatment right away.  CAUSES   Gallstones. Gallstones can block the tube that leads to your gallbladder, causing bile to build up. As bile builds up, the gallbladder becomes inflamed.  Bile duct problems, such as blockage from scarring or kinking.  Tumors. Tumors can stop bile from leaving your gallbladder correctly, causing bile to build up. As bile builds up, the gallbladder becomes inflamed. SYMPTOMS   Nausea.  Vomiting.  Abdominal pain, especially in the upper right area of your abdomen.  Abdominal tenderness or bloating.  Sweating.  Chills.  Fever.  Yellowing of the skin and the whites of the eyes (jaundice). DIAGNOSIS  Your caregiver may order blood tests to look for infection or gallbladder problems. Your caregiver may also order imaging tests, such as an ultrasound or computed tomography (CT) scan. Further tests may include a hepatobiliary iminodiacetic acid (HIDA) scan. This scan allows your caregiver to see your bile move from the liver to the gallbladder and to the small intestine. TREATMENT  A hospital stay is usually necessary to lessen the inflammation of your gallbladder. You may be required to not eat or drink (fast) for a certain amount of time. You may be given medicine to treat pain or an antibiotic medicine to treat an infection. Surgery may be needed to remove your gallbladder (cholecystectomy) once the inflammation has gone down. Surgery may be needed right away if you develop complications such as death of gallbladder tissue (gangrene) or a tear (perforation) of the gallbladder.  HOME CARE INSTRUCTIONS  Home care will depend on your treatment. In general:  If you were given antibiotics, take them as  directed. Finish them even if you start to feel better.  Only take over-the-counter or prescription medicines for pain, discomfort, or fever as directed by your caregiver.  Follow a low-fat diet until you see your caregiver again.  Keep all follow-up visits as directed by your caregiver. SEEK IMMEDIATE MEDICAL CARE IF:   Your pain is increasing and not controlled by medicines.  Your pain moves to another part of your abdomen or to your back.  You have a fever.  You have nausea and vomiting. MAKE SURE YOU:  Understand these instructions.  Will watch your condition.  Will get help right away if you are not doing well or get worse. Document Released: 11/13/2005 Document Revised: 02/05/2012 Document Reviewed: 09/29/2011 Exit[employer] Patient Information 2015 [employer]. This information is not intended to replace advice given to you by your health care provider. Make sure you discuss any questions you have with your health care provider.  

## 2015-07-07 NOTE — Assessment & Plan Note (Addendum)
Pt here for new RUQ abd pain of 1-2 weeks. Worse with food, lipase at ED along with labs were normal. Will get RUQ U/s, do CT abd if it is negative. In ER patient also had other labs which were all normal including a UA and chem panel.

## 2015-07-08 ENCOUNTER — Ambulatory Visit (HOSPITAL_COMMUNITY)
Admission: RE | Admit: 2015-07-08 | Discharge: 2015-07-08 | Disposition: A | Discharge status: Home / Self Care | Payer: BLUE CROSS/BLUE SHIELD | Source: Ambulatory Visit | Attending: Family Medicine | Admitting: Family Medicine

## 2015-07-08 ENCOUNTER — Other Ambulatory Visit: Payer: Self-pay | Admitting: Family Medicine

## 2015-07-08 DIAGNOSIS — R110 Nausea: Secondary | ICD-10-CM | POA: Diagnosis not present

## 2015-07-08 DIAGNOSIS — R1011 Right upper quadrant pain: Secondary | ICD-10-CM

## 2015-07-08 DIAGNOSIS — R1084 Generalized abdominal pain: Secondary | ICD-10-CM

## 2015-07-08 DIAGNOSIS — K760 Fatty (change of) liver, not elsewhere classified: Secondary | ICD-10-CM | POA: Insufficient documentation

## 2015-07-08 DIAGNOSIS — K5730 Diverticulosis of large intestine without perforation or abscess without bleeding: Secondary | ICD-10-CM | POA: Diagnosis not present

## 2015-07-08 MED ORDER — IOHEXOL 300 MG/ML  SOLN
100.0000 mL | Freq: Once | INTRAMUSCULAR | Status: AC | PRN
  Administered 2015-07-08: 100 mL via INTRAVENOUS

## 2015-07-14 ENCOUNTER — Ambulatory Visit (INDEPENDENT_AMBULATORY_CARE_PROVIDER_SITE_OTHER): Payer: BLUE CROSS/BLUE SHIELD | Admitting: Family Medicine

## 2015-07-14 ENCOUNTER — Encounter: Payer: Self-pay | Admitting: Family Medicine

## 2015-07-14 VITALS — BP 135/67 | HR 104 | Temp 98.8°F | Ht 65.0 in | Wt 267.0 lb

## 2015-07-14 DIAGNOSIS — R1011 Right upper quadrant pain: Secondary | ICD-10-CM

## 2015-07-14 MED ORDER — RANITIDINE HCL 150 MG PO TABS: 150.0000 mg | ORAL_TABLET | Freq: Two times a day (BID) | ORAL | Status: AC

## 2015-07-14 NOTE — Assessment & Plan Note (Signed)
Still 5/10

## 2015-07-14 NOTE — Progress Notes (Signed)
BP 135/67 mmHg  Pulse 104  Temp(Src) 98.8 F (37.1 C) (Oral)  Ht  (1.651 m)  Wt 267 lb (121.11 kg)  BMI 44.43 kg/m2  LMP 04/06/2015 (Approximate)   Subjective:    Patient ID: Nicole Pena, female    DOB: Feb 18, 1971, 44 y.o.   MRN: [id number]  HPI: Nicole Pena is a 44 y.o. female presenting on 07/14/2015 for Follow up abdominal pain   HPI Abdominal pain Patient presents with persistent abdominal pain, it is still worse after fatty foods but it is there all the time. She describes it as a 5 out of 10 today. A few days ago we had a gallbladder ultrasound and CT scan of the abdomen and pelvis performed which both came back normal except for on the gallbladder ultrasound that showed some sludge. Her abdominal pain is left upper quadrant and right upper quadrant but worse on the right upper quadrant still. She denies fevers or chills. She still does have occasional radiation up into her chest.  Relevant past medical, surgical, family and social history reviewed and updated as indicated. Interim medical history since our last visit reviewed. Allergies and medications reviewed and updated.  Review of Systems  Constitutional: Negative for fever and chills.  HENT: Negative for congestion and sore throat.   Eyes: Negative for redness and visual disturbance.  Respiratory: Positive for chest tightness (Intermittent and only lasts for a few seconds. Sharp). Negative for cough, shortness of breath and wheezing.   Cardiovascular: Positive for chest pain (Intermittent). Negative for palpitations.  Gastrointestinal: Positive for nausea and abdominal pain (right upper quadrant). Negative for vomiting, diarrhea, constipation, blood in stool and abdominal distention.  Genitourinary: Negative for dysuria, hematuria, vaginal bleeding, vaginal discharge, difficulty urinating, vaginal pain and pelvic pain.  Musculoskeletal: Positive for back pain (ain does radiate around to her right and left  flank).  Skin: Negative for color change and rash.  Allergic/Immunologic: Positive for environmental allergies.    Per HPI unless specifically indicated above     Medication List       This list is accurate as of: 07/14/15 11:11 AM.  Always use your most recent med list.               cetirizine 10 MG tablet  Commonly known as:  ZYRTEC  Take 10 mg by mouth daily.     Levonorgestrel-Ethinyl Estradiol 0.15-0.03 &0.01 MG tablet  Commonly known as:  AMETHIA,CAMRESE  Take 1 tablet by mouth daily.     LORazepam 0.5 MG tablet  Commonly known as:  ATIVAN  Take 0.25 mg by mouth at bedtime as needed. sleep     losartan 50 MG tablet  Commonly known as:  COZAAR  Take 50 mg by mouth at bedtime.     ranitidine 150 MG tablet  Commonly known as:  ZANTAC  Take 1 tablet (150 mg total) by mouth 2 (two) times daily.     zolpidem 10 MG tablet  Commonly known as:  AMBIEN  Take 10 mg by mouth at bedtime as needed for sleep.           Objective:    BP 135/67 mmHg  Pulse 104  Temp(Src) 98.8 F (37.1 C) (Oral)  Ht  (1.651 m)  Wt 267 lb (121.11 kg)  BMI 44.43 kg/m2  LMP 04/06/2015 (Approximate)  Wt Readings from Last 3 Encounters:  07/14/15 267 lb (121.11 kg)  07/07/15 266 lb 6.4 oz (120.838 kg)  07/01/15 265  lb (120.203 kg)    Physical Exam  Constitutional: She is oriented to person, place, and time. She appears well-developed and well-nourished. No distress.  Obese  Eyes: Conjunctivae and EOM are normal. Pupils are equal, round, and reactive to light.  Cardiovascular: Normal rate and regular rhythm.   No murmur heard. Pulmonary/Chest: Effort normal and breath sounds normal. No respiratory distress. She has no wheezes.  Abdominal: Soft. Bowel sounds are normal. She exhibits no distension, no fluid wave and no ascites. There is no hepatosplenomegaly. There is tenderness (worse in epigastrium and right upper quadrant but does have some tenderness in left upper quadrant and  left lower quadrant as well. No change on physical exam today from previous). There is positive Murphy's sign. There is no rebound, no guarding and no CVA tenderness.  Musculoskeletal: Normal range of motion. She exhibits no edema or tenderness.  Neurological: She is alert and oriented to person, place, and time. Coordination normal.  Skin: Skin is warm and dry. No rash noted. She is not diaphoretic.  Psychiatric: She has a normal mood and affect. Her behavior is normal.  Vitals reviewed.   Results for orders placed or performed during the hospital encounter of 07/01/15  CBC with Differential  Result Value Ref Range   WBC 6.7 4.0 - 10.5 K/uL   RBC 4.65 3.87 - 5.11 MIL/uL   Hemoglobin 13.4 12.0 - 15.0 g/dL   HCT 81.1 91.4 - 78.2 %   MCV 86.9 78.0 - 100.0 fL   MCH 28.8 26.0 - 34.0 pg   MCHC 33.2 30.0 - 36.0 g/dL   RDW 95.6 21.3 - 08.6 %   Platelets 303 150 - 400 K/uL   Neutrophils Relative % 58 43 - 77 %   Neutro Abs 3.9 1.7 - 7.7 K/uL   Lymphocytes Relative 33 12 - 46 %   Lymphs Abs 2.2 0.7 - 4.0 K/uL   Monocytes Relative 6 3 - 12 %   Monocytes Absolute 0.4 0.1 - 1.0 K/uL   Eosinophils Relative 3 0 - 5 %   Eosinophils Absolute 0.2 0.0 - 0.7 K/uL   Basophils Relative 0 0 - 1 %   Basophils Absolute 0.0 0.0 - 0.1 K/uL  Lipase, blood  Result Value Ref Range   Lipase 30 22 - 51 U/L  Basic metabolic panel  Result Value Ref Range   Sodium 138 135 - 145 mmol/L   Potassium 3.6 3.5 - 5.1 mmol/L   Chloride 107 101 - 111 mmol/L   CO2 24 22 - 32 mmol/L   Glucose, Bld 114 (H) 65 - 99 mg/dL   BUN 12 6 - 20 mg/dL   Creatinine, Ser 5.78 0.44 - 1.00 mg/dL   Calcium 8.5 (L) 8.9 - 10.3 mg/dL   GFR calc non Af Amer >60 >60 mL/min   GFR calc Af Amer >60 >60 mL/min   Anion gap 7 5 - 15  D-dimer, quantitative (not at [facility])  Result Value Ref Range   D-Dimer, Quant <0.27 0.00 - 0.48 ug/mL-FEU  Urinalysis, Routine w reflex microscopic (not at [facility])  Result Value Ref Range   Color, Urine AMBER  (A) YELLOW   APPearance CLOUDY (A) CLEAR   Specific Gravity, Urine 1.039 (H) 1.005 - 1.030   pH 6.0 5.0 - 8.0   Glucose, UA NEGATIVE NEGATIVE mg/dL   Hgb urine dipstick NEGATIVE NEGATIVE   Bilirubin Urine SMALL (A) NEGATIVE   Ketones, ur NEGATIVE NEGATIVE mg/dL   Protein, ur NEGATIVE NEGATIVE mg/dL  Urobilinogen, UA 1.0 0.0 - 1.0 mg/dL   Nitrite NEGATIVE NEGATIVE   Leukocytes, UA SMALL (A) NEGATIVE  Urine microscopic-add on  Result Value Ref Range   Squamous Epithelial / LPF RARE RARE   WBC, UA 0-2 <3 WBC/hpf   Bacteria, UA MANY (A) RARE   Urine-Other MUCOUS PRESENT   I-Stat Troponin, ED (not at [facility])  Result Value Ref Range   Troponin i, poc 0.00 0.00 - 0.08 ng/mL   Comment 3          I-Stat Troponin, ED (not at [facility])  Result Value Ref Range   Troponin i, poc 0.00 0.00 - 0.08 ng/mL   Comment 3              Assessment & Plan:   Problem List Items Addressed This Visit      Other   Abdominal pain, right upper quadrant - Primary    Still 5/10      Relevant Orders   NM Hepato W/Eject Fract   Ambulatory referral to General Surgery       Follow up plan: Return in about 4 weeks (around 08/11/2015), or if symptoms worsen or fail to improve.  Arville Care, MD [facility] 07/14/2015, 11:11 AM

## 2015-07-14 NOTE — Patient Instructions (Signed)

## 2015-07-15 ENCOUNTER — Telehealth: Payer: Self-pay

## 2015-07-15 NOTE — Telephone Encounter (Signed)
Pt aware of appointment date/time 

## 2015-07-15 NOTE — Telephone Encounter (Signed)
[facility] to x-ray: Pt has an appointment on 07/20/2015 at 8:00am at [facility] for a HIDA Scan Arrive at 7:45 for registration  Nothing to eat or drink after midnight and no pain medication after midnight

## 2015-07-20 ENCOUNTER — Encounter (HOSPITAL_COMMUNITY): Payer: Self-pay

## 2015-07-20 ENCOUNTER — Encounter (HOSPITAL_COMMUNITY)
Admission: RE | Admit: 2015-07-20 | Discharge: 2015-07-20 | Disposition: A | Discharge status: Home / Self Care | Payer: BLUE CROSS/BLUE SHIELD | Source: Ambulatory Visit | Attending: Family Medicine | Admitting: Family Medicine

## 2015-07-20 DIAGNOSIS — R1011 Right upper quadrant pain: Secondary | ICD-10-CM

## 2015-07-20 MED ORDER — STERILE WATER FOR INJECTION IJ SOLN
INTRAMUSCULAR | Status: AC
  Administered 2015-07-20: 2.43 mL via INTRAVENOUS
  Filled 2015-07-20: qty 10

## 2015-07-20 MED ORDER — SODIUM CHLORIDE 0.9 % IJ SOLN
INTRAMUSCULAR | Status: AC
  Filled 2015-07-20: qty 12

## 2015-07-20 MED ORDER — TECHNETIUM TC 99M MEBROFENIN IV KIT
5.0000 | PACK | Freq: Once | INTRAVENOUS | Status: DC | PRN
  Administered 2015-07-20: 5.4 via INTRAVENOUS
  Filled 2015-07-20: qty 6

## 2015-07-20 MED ORDER — SINCALIDE 5 MCG IJ SOLR
INTRAMUSCULAR | Status: AC
  Administered 2015-07-20: 2.43 ug via INTRAVENOUS
  Filled 2015-07-20: qty 5

## 2015-07-21 ENCOUNTER — Telehealth: Payer: Self-pay | Admitting: *Deleted

## 2015-07-21 DIAGNOSIS — R1011 Right upper quadrant pain: Secondary | ICD-10-CM

## 2015-07-21 NOTE — Telephone Encounter (Signed)
-----   Message from Nils Pyle, MD sent at 07/20/2015 11:01 AM EDT ----- Please inform the cold that she had a normal HIDA scan. I would still like her to go see the surgeon, but I would also like to add a referral to a gastroenterologist if she is still having pain. If you could put that inform he thank you. Arville Care, MD [facility] 07/20/2015, 11:00 AM

## 2015-07-21 NOTE — Telephone Encounter (Signed)
Patient aware of results.

## 2015-07-28 ENCOUNTER — Encounter: Payer: Self-pay | Admitting: Family Medicine

## 2015-07-28 MED ORDER — HYDROCODONE-ACETAMINOPHEN 5-325 MG PO TABS: 1.0000 | ORAL_TABLET | Freq: Four times a day (QID) | ORAL | Status: DC | PRN

## 2015-07-28 NOTE — Telephone Encounter (Signed)
Continued abdominal pain, patient calls in requesting something instructed and Tylenol. Printed out a prescription for Norco that she can come pick up. Arville Care, MD [facility] 07/28/2015, 4:36 PM

## 2015-08-06 ENCOUNTER — Other Ambulatory Visit: Payer: Self-pay | Admitting: Surgery

## 2015-08-13 ENCOUNTER — Encounter: Payer: Self-pay | Admitting: Family Medicine

## 2015-08-13 ENCOUNTER — Ambulatory Visit (INDEPENDENT_AMBULATORY_CARE_PROVIDER_SITE_OTHER): Payer: BLUE CROSS/BLUE SHIELD | Admitting: Family Medicine

## 2015-08-13 VITALS — BP 126/82 | HR 84 | Temp 98.8°F | Ht 65.0 in | Wt 270.0 lb

## 2015-08-13 DIAGNOSIS — R1011 Right upper quadrant pain: Secondary | ICD-10-CM

## 2015-08-13 MED ORDER — HYDROCODONE-ACETAMINOPHEN 5-325 MG PO TABS: 1.0000 | ORAL_TABLET | Freq: Four times a day (QID) | ORAL | Status: DC | PRN

## 2015-08-13 NOTE — Progress Notes (Signed)
BP 126/82 mmHg  Pulse 84  Temp(Src) 98.8 F (37.1 C) (Oral)  Ht 5\' 5"  (1.651 m)  Wt 270 lb (122.471 kg)  BMI 44.93 kg/m2  LMP 07/20/2015   Subjective:    Patient ID: Nicole Pena, female    DOB: 1971/10/28, 44 y.o.   MRN: [id number]  HPI: Nicole Pena is a 44 y.o. female presenting on 08/13/2015 for Abdominal Pain   HPI Abdominal Pain Patient presents for recheck on abdominal pain. HIDA scan showed sludge but no slowing and right upper quadrant ultrasound showed sludge as well. Sent to [facility] and they are planning to do a gallbladder removal in the next week or so. Patient has been using Norco to help with the pain but is almost out. Pain is still same as before which worsens with meals that are especially fatty or fried or greasy. Pain still radiates a little bit into the left upper quadrant but is worse in the right upper quadrant.  Relevant past medical, surgical, family and social history reviewed and updated as indicated. Interim medical history since our last visit reviewed. Allergies and medications reviewed and updated.  Review of Systems  Constitutional: Negative for fever and chills.  HENT: Negative for congestion, ear discharge and ear pain.   Eyes: Negative for redness and visual disturbance.  Respiratory: Negative for chest tightness and shortness of breath.   Cardiovascular: Negative for chest pain and leg swelling.  Gastrointestinal: Positive for abdominal pain. Negative for nausea, vomiting, diarrhea and constipation.  Genitourinary: Negative for dysuria and difficulty urinating.  Musculoskeletal: Negative for back pain and gait problem.  Skin: Negative for rash.  Neurological: Negative for light-headedness and headaches.  Psychiatric/Behavioral: Negative for behavioral problems and agitation.  All other systems reviewed and are negative.   Per HPI unless specifically indicated above     Medication List       This list is accurate as of:  08/13/15 10:08 AM.  Always use your most recent med list.               cetirizine 10 MG tablet  Commonly known as:  ZYRTEC  Take 10 mg by mouth daily.     HYDROcodone-acetaminophen 5-325 MG per tablet  Commonly known as:  NORCO  Take 1 tablet by mouth every 6 (six) hours as needed for moderate pain.     Levonorgestrel-Ethinyl Estradiol 0.15-0.03 &0.01 MG tablet  Commonly known as:  AMETHIA,CAMRESE  Take 1 tablet by mouth daily.     LORazepam 0.5 MG tablet  Commonly known as:  ATIVAN  Take 0.25 mg by mouth at bedtime as needed. sleep     losartan 50 MG tablet  Commonly known as:  COZAAR  Take 50 mg by mouth at bedtime.     ranitidine 150 MG tablet  Commonly known as:  ZANTAC  Take 1 tablet (150 mg total) by mouth 2 (two) times daily.     zolpidem 10 MG tablet  Commonly known as:  AMBIEN  Take 10 mg by mouth at bedtime as needed for sleep.           Objective:    BP 126/82 mmHg  Pulse 84  Temp(Src) 98.8 F (37.1 C) (Oral)  Ht 5\' 5"  (1.651 m)  Wt 270 lb (122.471 kg)  BMI 44.93 kg/m2  LMP 07/20/2015  Wt Readings from Last 3 Encounters:  08/13/15 270 lb (122.471 kg)  07/14/15 267 lb (121.11 kg)  07/07/15 266 lb 6.4 oz (120.838  kg)    Physical Exam  Constitutional: She is oriented to person, place, and time. She appears well-developed and well-nourished. No distress.  Eyes: Conjunctivae and EOM are normal. Pupils are equal, round, and reactive to light.  Neck: Neck supple. No thyromegaly present.  Cardiovascular: Normal rate, regular rhythm, normal heart sounds and intact distal pulses.   No murmur heard. Pulmonary/Chest: Effort normal and breath sounds normal. No respiratory distress. She has no wheezes.  Abdominal: Soft. Bowel sounds are normal. There is tenderness. There is no rebound and no guarding.  Musculoskeletal: Normal range of motion. She exhibits no edema or tenderness.  Lymphadenopathy:    She has no cervical adenopathy.  Neurological: She is  alert and oriented to person, place, and time. Coordination normal.  Skin: Skin is warm and dry. No rash noted. She is not diaphoretic.  Psychiatric: She has a normal mood and affect. Her behavior is normal.  Vitals reviewed.   Results for orders placed or performed during the hospital encounter of 07/01/15  CBC with Differential  Result Value Ref Range   WBC 6.7 4.0 - 10.5 K/uL   RBC 4.65 3.87 - 5.11 MIL/uL   Hemoglobin 13.4 12.0 - 15.0 g/dL   HCT 40.9 81.1 - 91.4 %   MCV 86.9 78.0 - 100.0 fL   MCH 28.8 26.0 - 34.0 pg   MCHC 33.2 30.0 - 36.0 g/dL   RDW 78.2 95.6 - 21.3 %   Platelets 303 150 - 400 K/uL   Neutrophils Relative % 58 43 - 77 %   Neutro Abs 3.9 1.7 - 7.7 K/uL   Lymphocytes Relative 33 12 - 46 %   Lymphs Abs 2.2 0.7 - 4.0 K/uL   Monocytes Relative 6 3 - 12 %   Monocytes Absolute 0.4 0.1 - 1.0 K/uL   Eosinophils Relative 3 0 - 5 %   Eosinophils Absolute 0.2 0.0 - 0.7 K/uL   Basophils Relative 0 0 - 1 %   Basophils Absolute 0.0 0.0 - 0.1 K/uL  Lipase, blood  Result Value Ref Range   Lipase 30 22 - 51 U/L  Basic metabolic panel  Result Value Ref Range   Sodium 138 135 - 145 mmol/L   Potassium 3.6 3.5 - 5.1 mmol/L   Chloride 107 101 - 111 mmol/L   CO2 24 22 - 32 mmol/L   Glucose, Bld 114 (H) 65 - 99 mg/dL   BUN 12 6 - 20 mg/dL   Creatinine, Ser 0.86 0.44 - 1.00 mg/dL   Calcium 8.5 (L) 8.9 - 10.3 mg/dL   GFR calc non Af Amer >60 >60 mL/min   GFR calc Af Amer >60 >60 mL/min   Anion gap 7 5 - 15  D-dimer, quantitative (not at [facility])  Result Value Ref Range   D-Dimer, Quant <0.27 0.00 - 0.48 ug/mL-FEU  Urinalysis, Routine w reflex microscopic (not at [facility])  Result Value Ref Range   Color, Urine AMBER (A) YELLOW   APPearance CLOUDY (A) CLEAR   Specific Gravity, Urine 1.039 (H) 1.005 - 1.030   pH 6.0 5.0 - 8.0   Glucose, UA NEGATIVE NEGATIVE mg/dL   Hgb urine dipstick NEGATIVE NEGATIVE   Bilirubin Urine SMALL (A) NEGATIVE   Ketones, ur NEGATIVE NEGATIVE mg/dL     Protein, ur NEGATIVE NEGATIVE mg/dL   Urobilinogen, UA 1.0 0.0 - 1.0 mg/dL   Nitrite NEGATIVE NEGATIVE   Leukocytes, UA SMALL (A) NEGATIVE  Urine microscopic-add on  Result Value Ref Range  Squamous Epithelial / LPF RARE RARE   WBC, UA 0-2 <3 WBC/hpf   Bacteria, UA MANY (A) RARE   Urine-Other MUCOUS PRESENT   I-Stat Troponin, ED (not at [facility])  Result Value Ref Range   Troponin i, poc 0.00 0.00 - 0.08 ng/mL   Comment 3          I-Stat Troponin, ED (not at [facility])  Result Value Ref Range   Troponin i, poc 0.00 0.00 - 0.08 ng/mL   Comment 3              Assessment & Plan:   Problem List Items Addressed This Visit      Other   Abdominal pain, right upper quadrant - Primary    Patient has continued pain that is colicky and consistent with cholecystitis. Surgeon plans to take it out this next week as soon as they can set a date. She continues takes Zantac. Gave refill on Norco.          Follow up plan: Return if symptoms worsen or fail to improve.  Arville Care, MD [facility] 08/13/2015, 10:08 AM

## 2015-08-13 NOTE — Assessment & Plan Note (Signed)
Patient has continued pain that is colicky and consistent with cholecystitis. Surgeon plans to take it out this next week as soon as they can set a date. She continues takes Zantac. Gave refill on Norco.

## 2015-08-17 ENCOUNTER — Encounter (HOSPITAL_BASED_OUTPATIENT_CLINIC_OR_DEPARTMENT_OTHER): Payer: Self-pay | Admitting: *Deleted

## 2015-08-17 NOTE — Progress Notes (Signed)
Chart reviewed by Dr Maple Hudson, [place] for [facility]

## 2015-08-17 NOTE — H&P (Signed)
Nicole Pena 08/06/2015 10:57 AM Location: [facility] Patient #: [id number] DOB: 03-18-1971 Married / Language: English / Race: White Female  History of Present Illness (Douglas A. Magnus Ivan MD; 08/06/2015 11:20 AM) Patient words: gallbladder.  The patient is a 44 year old female who presents for evaluation of gall stones. She is referred by Dr. Rudi Heap for evaluation of abdominal pain and biliary sludge. She has been having epigastric abdominal pain hurting due to her chest as well as right and left upper quadrants for several months. She has had some nausea and vomiting. It occurs after fatty meals. She also had a kidney stone which was removed earlier this year. She has had no abdominal complaints in the past. She is giving her bowels well. She has had an extensive workup including CAT scans, HIDA scan, ultrasound, etc. She is otherwise without complaints   Other Problems Michel Bickers, LPN; 01/05/5187 41:66 AM) Anxiety Disorder Back Pain Chest pain High blood pressure Hypercholesterolemia Kidney Stone Migraine Headache  Past Surgical History Michel Bickers, LPN; 0/04/3015 01:09 AM) Colon Polyp Removal - Colonoscopy Foot Surgery Left. Knee Surgery Right. Oral Surgery  Diagnostic Studies History Michel Bickers, LPN; 01/27/3556 32:20 AM) Colonoscopy 5-10 years ago Mammogram 1-3 years ago Pap Smear 1-5 years ago  Allergies Michel Bickers, LPN; 01/01/4269 62:37 AM) Naproxen *ANALGESICS - ANTI-INFLAMMATORY* hallucinations Statins muscle aches  Medication History Michel Bickers, LPN; 04/28/8314 17:61 AM) LORazepam (0.5MG  Tablet, Oral) Active. Zolpidem Tartrate (  Tablet, Oral) Active. Camrese (0.15-0.03 &0.01MG  Tablet, Oral) Active. Losartan Potassium (  Tablet, Oral) Active. ZyrTEC Allergy (  Capsule, Oral) Active. Medications Reconciled  Social History Michel Bickers, LPN; 6/0/7371 06:26 AM) Alcohol use Occasional alcohol  use. Caffeine use Carbonated beverages, Tea. No drug use Tobacco use Never smoker.  Family History Michel Bickers, LPN; 07/31/8545 27:03 AM) Cancer Father. Colon Polyps Brother, Mother. Diabetes Mellitus Father. Heart Disease Father, Mother. Hypertension Brother, Father, Mother.  Pregnancy / Birth History Michel Bickers, LPN; 5/0/0938 18:29 AM) Age at menarche 14 years. Contraceptive History Contraceptive implant, Oral contraceptives. Gravida 1 Maternal age 27-35 Para 0 Regular periods  Review of Systems Tresa Endo Dockery LPN; 07/30/7168 67:89 AM) General Present- Fatigue and Night Sweats. Not Present- Appetite Loss, Chills, Fever, Weight Gain and Weight Loss. Skin Not Present- Change in Wart/Mole, Dryness, Hives, Jaundice, New Lesions, Non-Healing Wounds, Rash and Ulcer. HEENT Present- Seasonal Allergies and Wears glasses/contact lenses. Not Present- Earache, Hearing Loss, Hoarseness, Nose Bleed, Oral Ulcers, Ringing in the Ears, Sinus Pain, Sore Throat, Visual Disturbances and Yellow Eyes. Respiratory Not Present- Bloody sputum, Chronic Cough, Difficulty Breathing, Snoring and Wheezing. Breast Not Present- Breast Mass, Breast Pain, Nipple Discharge and Skin Changes. Cardiovascular Not Present- Chest Pain, Difficulty Breathing Lying Down, Leg Cramps, Palpitations, Rapid Heart Rate, Shortness of Breath and Swelling of Extremities. Gastrointestinal Present- Abdominal Pain and Nausea. Not Present- Bloating, Bloody Stool, Change in Bowel Habits, Chronic diarrhea, Constipation, Difficulty Swallowing, Excessive gas, Gets full quickly at meals, Hemorrhoids, Indigestion, Rectal Pain and Vomiting. Female Genitourinary Not Present- Frequency, Nocturia, Painful Urination, Pelvic Pain and Urgency. Musculoskeletal Not Present- Back Pain, Joint Pain, Joint Stiffness, Muscle Pain, Muscle Weakness and Swelling of Extremities. Neurological Not Present- Decreased Memory, Fainting, Headaches,  Numbness, Seizures, Tingling, Tremor, Trouble walking and Weakness. Psychiatric Not Present- Anxiety, Bipolar, Change in Sleep Pattern, Depression, Fearful and Frequent crying. Endocrine Not Present- Cold Intolerance, Excessive Hunger, Hair Changes, Heat Intolerance, Hot flashes and New Diabetes. Hematology Not Present- Easy Bruising, Excessive bleeding, Gland problems, HIV and Persistent Infections.  Vitals Tresa Endo Dockery LPN; 12/02/1094 04:54 AM) 08/06/2015 10:57 AM Weight: 268.4 lb Height: 65.5in Body Surface Area: 2.37 m Body Mass Index: 43.98 kg/m Temp.: 99.47F(Oral)  Pulse: 102 (Regular)  BP: 124/86 (Sitting, Left Arm, Standard)    Physical Exam (Douglas A. Magnus Ivan MD; 08/06/2015 11:20 AM) General Mental Status-Alert. General Appearance-Consistent with stated age. Hydration-Well hydrated. Voice-Normal.  Head and Neck Head-normocephalic, atraumatic with no lesions or palpable masses.  Eye Eyeball - Bilateral-Extraocular movements intact. Sclera/Conjunctiva - Bilateral-No scleral icterus.  Chest and Lung Exam Chest and lung exam reveals -quiet, even and easy respiratory effort with no use of accessory muscles and on auscultation, normal breath sounds, no adventitious sounds and normal vocal resonance. Inspection Chest Wall - Normal. Back - normal.  Cardiovascular Cardiovascular examination reveals -on palpation PMI is normal in location and amplitude, no palpable S3 or S4. Normal cardiac borders., normal heart sounds, regular rate and rhythm with no murmurs, carotid auscultation reveals no bruits and normal pedal pulses bilaterally.  Abdomen Inspection Inspection of the abdomen reveals - No Hernias. Skin - Scar - no surgical scars. Palpation/Percussion Palpation and Percussion of the abdomen reveal - Soft, No Rebound tenderness, No Rigidity (guarding) and No hepatosplenomegaly. Tenderness - Right Upper Quadrant. Auscultation Auscultation of  the abdomen reveals - Bowel sounds normal.  Neurologic Neurologic evaluation reveals -alert and oriented x 3 with no impairment of recent or remote memory. Mental Status-Normal.  Musculoskeletal Normal Exam - Left-Upper Extremity Strength Normal and Lower Extremity Strength Normal. Normal Exam - Right-Upper Extremity Strength Normal, Lower Extremity Weakness.    Assessment & Plan (Douglas A. 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 We discussed laparoscopic cholecystectomy. I gave her literature regarding surgery. We discussed the risk of surgery which includes but is not limited to bleeding, infection, duct injury, leak, the need to convert to an open procedure, postoperative recovery, cardiopulmonary she's, DVT, and a chance this may not resolve her symptoms. She understands and wishes to proceed with surgery Current Plans  Started Hydrocodone-Acetaminophen 5-325MG , 1 (one) Tablet every four hours, as needed, #40, 08/06/2015, No Refill. Pt Education - Pamphlet Given - Laparoscopic Gallbladder Surgery: discussed with patient and provided information.   Signed by Shelly Rubenstein, MD (08/06/2015 11:21 AM)

## 2015-08-18 ENCOUNTER — Encounter (HOSPITAL_BASED_OUTPATIENT_CLINIC_OR_DEPARTMENT_OTHER): Payer: Self-pay | Admitting: Anesthesiology

## 2015-08-18 ENCOUNTER — Ambulatory Visit (HOSPITAL_BASED_OUTPATIENT_CLINIC_OR_DEPARTMENT_OTHER): Payer: BLUE CROSS/BLUE SHIELD | Admitting: Anesthesiology

## 2015-08-18 ENCOUNTER — Ambulatory Visit (HOSPITAL_BASED_OUTPATIENT_CLINIC_OR_DEPARTMENT_OTHER)
Admission: RE | Admit: 2015-08-18 | Discharge: 2015-08-18 | Disposition: A | Discharge status: Home / Self Care | Payer: BLUE CROSS/BLUE SHIELD | Source: Ambulatory Visit | Attending: Surgery | Admitting: Surgery

## 2015-08-18 ENCOUNTER — Encounter (HOSPITAL_BASED_OUTPATIENT_CLINIC_OR_DEPARTMENT_OTHER)
Admission: RE | Disposition: A | Discharge status: Home / Self Care | Payer: Self-pay | Source: Ambulatory Visit | Attending: Surgery

## 2015-08-18 DIAGNOSIS — Z87442 Personal history of urinary calculi: Secondary | ICD-10-CM | POA: Insufficient documentation

## 2015-08-18 DIAGNOSIS — E780 Pure hypercholesterolemia: Secondary | ICD-10-CM | POA: Insufficient documentation

## 2015-08-18 DIAGNOSIS — F419 Anxiety disorder, unspecified: Secondary | ICD-10-CM | POA: Insufficient documentation

## 2015-08-18 DIAGNOSIS — Z888 Allergy status to other drugs, medicaments and biological substances status: Secondary | ICD-10-CM | POA: Diagnosis not present

## 2015-08-18 DIAGNOSIS — K829 Disease of gallbladder, unspecified: Secondary | ICD-10-CM | POA: Diagnosis present

## 2015-08-18 DIAGNOSIS — I10 Essential (primary) hypertension: Secondary | ICD-10-CM | POA: Diagnosis not present

## 2015-08-18 DIAGNOSIS — Z886 Allergy status to analgesic agent status: Secondary | ICD-10-CM | POA: Insufficient documentation

## 2015-08-18 DIAGNOSIS — G43909 Migraine, unspecified, not intractable, without status migrainosus: Secondary | ICD-10-CM | POA: Insufficient documentation

## 2015-08-18 DIAGNOSIS — Z6841 Body Mass Index (BMI) 40.0 and over, adult: Secondary | ICD-10-CM | POA: Diagnosis not present

## 2015-08-18 DIAGNOSIS — K811 Chronic cholecystitis: Secondary | ICD-10-CM | POA: Diagnosis not present

## 2015-08-18 HISTORY — PX: CHOLECYSTECTOMY: SHX55

## 2015-08-18 LAB — POCT HEMOGLOBIN-HEMACUE: Hemoglobin: 14.6 g/dL (ref 12.0–15.0)

## 2015-08-18 SURGERY — LAPAROSCOPIC CHOLECYSTECTOMY
Anesthesia: General

## 2015-08-18 MED ORDER — KETOROLAC TROMETHAMINE 30 MG/ML IJ SOLN
INTRAMUSCULAR | Status: DC | PRN
  Administered 2015-08-18: 30 mg via INTRAVENOUS

## 2015-08-18 MED ORDER — OXYCODONE HCL 5 MG/5ML PO SOLN: 5.0000 mg | Freq: Once | ORAL | Status: DC | PRN

## 2015-08-18 MED ORDER — ROCURONIUM BROMIDE 50 MG/5ML IV SOLN
INTRAVENOUS | Status: AC
  Filled 2015-08-18: qty 1

## 2015-08-18 MED ORDER — PHENYLEPHRINE 40 MCG/ML (10ML) SYRINGE FOR IV PUSH (FOR BLOOD PRESSURE SUPPORT)
PREFILLED_SYRINGE | INTRAVENOUS | Status: AC
  Filled 2015-08-18: qty 10

## 2015-08-18 MED ORDER — DEXAMETHASONE SODIUM PHOSPHATE 4 MG/ML IJ SOLN
INTRAMUSCULAR | Status: DC | PRN
  Administered 2015-08-18: 10 mg via INTRAVENOUS

## 2015-08-18 MED ORDER — HYDROMORPHONE HCL 1 MG/ML IJ SOLN
INTRAMUSCULAR | Status: AC
Start: 2015-08-18 — End: 2015-08-18
  Filled 2015-08-18: qty 1

## 2015-08-18 MED ORDER — ARTIFICIAL TEARS OP OINT
TOPICAL_OINTMENT | OPHTHALMIC | Status: AC
  Filled 2015-08-18: qty 3.5

## 2015-08-18 MED ORDER — CEFAZOLIN SODIUM-DEXTROSE 2-3 GM-% IV SOLR
2.0000 g | INTRAVENOUS | Status: AC
Start: 2015-08-19 — End: 2015-08-18
  Administered 2015-08-18: 2 g via INTRAVENOUS

## 2015-08-18 MED ORDER — HYDROCODONE-ACETAMINOPHEN 5-325 MG PO TABS: 1.0000 | ORAL_TABLET | ORAL | Status: DC | PRN

## 2015-08-18 MED ORDER — SUCCINYLCHOLINE CHLORIDE 20 MG/ML IJ SOLN
INTRAMUSCULAR | Status: DC | PRN
  Administered 2015-08-18: 100 mg via INTRAVENOUS

## 2015-08-18 MED ORDER — SCOPOLAMINE 1 MG/3DAYS TD PT72
MEDICATED_PATCH | TRANSDERMAL | Status: AC
  Filled 2015-08-18: qty 1

## 2015-08-18 MED ORDER — DEXAMETHASONE SODIUM PHOSPHATE 10 MG/ML IJ SOLN
INTRAMUSCULAR | Status: AC
  Filled 2015-08-18: qty 1

## 2015-08-18 MED ORDER — CEFAZOLIN SODIUM-DEXTROSE 2-3 GM-% IV SOLR
INTRAVENOUS | Status: AC
  Filled 2015-08-18: qty 50

## 2015-08-18 MED ORDER — GLYCOPYRROLATE 0.2 MG/ML IJ SOLN
INTRAMUSCULAR | Status: AC
  Filled 2015-08-18: qty 2

## 2015-08-18 MED ORDER — GLYCOPYRROLATE 0.2 MG/ML IJ SOLN
0.2000 mg | Freq: Once | INTRAMUSCULAR | Status: AC | PRN
  Administered 2015-08-18: 0.4 mg via INTRAVENOUS

## 2015-08-18 MED ORDER — NEOSTIGMINE METHYLSULFATE 10 MG/10ML IV SOLN
INTRAVENOUS | Status: DC | PRN
  Administered 2015-08-18: 4 mg via INTRAVENOUS

## 2015-08-18 MED ORDER — ROCURONIUM BROMIDE 100 MG/10ML IV SOLN
INTRAVENOUS | Status: DC | PRN
  Administered 2015-08-18: 30 mg via INTRAVENOUS
  Administered 2015-08-18: 10 mg via INTRAVENOUS

## 2015-08-18 MED ORDER — BUPIVACAINE-EPINEPHRINE (PF) 0.5% -1:200000 IJ SOLN
INTRAMUSCULAR | Status: DC | PRN
  Administered 2015-08-18: 20 mL via PERINEURAL

## 2015-08-18 MED ORDER — PROPOFOL 10 MG/ML IV BOLUS
INTRAVENOUS | Status: AC
Start: 2015-08-18 — End: 2015-08-18
  Filled 2015-08-18: qty 20

## 2015-08-18 MED ORDER — MIDAZOLAM HCL 2 MG/2ML IJ SOLN
INTRAMUSCULAR | Status: AC
  Filled 2015-08-18: qty 4

## 2015-08-18 MED ORDER — ONDANSETRON HCL 4 MG/2ML IJ SOLN
INTRAMUSCULAR | Status: DC | PRN
  Administered 2015-08-18: 4 mg via INTRAVENOUS

## 2015-08-18 MED ORDER — LIDOCAINE HCL (CARDIAC) 20 MG/ML IV SOLN
INTRAVENOUS | Status: DC | PRN
  Administered 2015-08-18: 100 mg via INTRAVENOUS

## 2015-08-18 MED ORDER — MIDAZOLAM HCL 2 MG/2ML IJ SOLN
1.0000 mg | INTRAMUSCULAR | Status: DC | PRN
  Administered 2015-08-18: 2 mg via INTRAVENOUS

## 2015-08-18 MED ORDER — FENTANYL CITRATE (PF) 100 MCG/2ML IJ SOLN
50.0000 ug | INTRAMUSCULAR | Status: DC | PRN
Start: 2015-08-18 — End: 2015-08-18
  Administered 2015-08-18: 100 ug via INTRAVENOUS

## 2015-08-18 MED ORDER — LACTATED RINGERS IV SOLN
INTRAVENOUS | Status: DC
  Administered 2015-08-18 (×2): via INTRAVENOUS

## 2015-08-18 MED ORDER — OXYCODONE HCL 5 MG PO TABS: 5.0000 mg | ORAL_TABLET | Freq: Once | ORAL | Status: DC | PRN

## 2015-08-18 MED ORDER — ONDANSETRON HCL 4 MG/2ML IJ SOLN
INTRAMUSCULAR | Status: AC
  Filled 2015-08-18: qty 2

## 2015-08-18 MED ORDER — NEOSTIGMINE METHYLSULFATE 10 MG/10ML IV SOLN
INTRAVENOUS | Status: AC
  Filled 2015-08-18: qty 1

## 2015-08-18 MED ORDER — KETOROLAC TROMETHAMINE 30 MG/ML IJ SOLN
INTRAMUSCULAR | Status: AC
  Filled 2015-08-18: qty 2

## 2015-08-18 MED ORDER — HYDROMORPHONE HCL 1 MG/ML IJ SOLN
INTRAMUSCULAR | Status: AC
  Filled 2015-08-18: qty 1

## 2015-08-18 MED ORDER — SODIUM CHLORIDE 0.9 % IR SOLN
Status: DC | PRN
  Administered 2015-08-18: 500 mL

## 2015-08-18 MED ORDER — FENTANYL CITRATE (PF) 100 MCG/2ML IJ SOLN
INTRAMUSCULAR | Status: AC
  Filled 2015-08-18: qty 4

## 2015-08-18 MED ORDER — PROPOFOL 10 MG/ML IV BOLUS
INTRAVENOUS | Status: DC | PRN
  Administered 2015-08-18: 200 mg via INTRAVENOUS
  Administered 2015-08-18: 30 mg via INTRAVENOUS

## 2015-08-18 MED ORDER — SCOPOLAMINE 1 MG/3DAYS TD PT72
1.0000 | MEDICATED_PATCH | Freq: Once | TRANSDERMAL | Status: DC | PRN
  Administered 2015-08-18: 1.5 mg via TRANSDERMAL

## 2015-08-18 MED ORDER — ONDANSETRON HCL 4 MG PO TABS: 4.0000 mg | ORAL_TABLET | Freq: Three times a day (TID) | ORAL | Status: AC | PRN

## 2015-08-18 MED ORDER — HYDROMORPHONE HCL 1 MG/ML IJ SOLN
0.2500 mg | INTRAMUSCULAR | Status: DC | PRN
  Administered 2015-08-18: 0.25 mg via INTRAVENOUS
  Administered 2015-08-18: 0.5 mg via INTRAVENOUS
  Administered 2015-08-18: 0.25 mg via INTRAVENOUS
  Administered 2015-08-18 (×2): 0.5 mg via INTRAVENOUS

## 2015-08-18 MED ORDER — MEPERIDINE HCL 25 MG/ML IJ SOLN: 6.2500 mg | INTRAMUSCULAR | Status: DC | PRN

## 2015-08-18 SURGICAL SUPPLY — 35 items
APPLIER CLIP 5 13 M/L LIGAMAX5 (MISCELLANEOUS) ×3
BLADE CLIPPER SURG (BLADE) IMPLANT
CHLORAPREP W/TINT 26ML (MISCELLANEOUS) ×3 IMPLANT
CLIP APPLIE 5 13 M/L LIGAMAX5 (MISCELLANEOUS) ×1 IMPLANT
COVER MAYO STAND STRL (DRAPES) IMPLANT
DECANTER SPIKE VIAL GLASS SM (MISCELLANEOUS) ×3 IMPLANT
DRAPE C-ARM 42X72 X-RAY (DRAPES) IMPLANT
DRAPE LAPAROSCOPIC ABDOMINAL (DRAPES) ×3 IMPLANT
ELECT REM PT RETURN 9FT ADLT (ELECTROSURGICAL) ×3
ELECTRODE REM PT RTRN 9FT ADLT (ELECTROSURGICAL) ×1 IMPLANT
FILTER SMOKE EVAC LAPAROSHD (FILTER) IMPLANT
GLOVE BIOGEL PI IND STRL 6.5 (GLOVE) ×2 IMPLANT
GLOVE BIOGEL PI INDICATOR 6.5 (GLOVE) ×4
GLOVE ECLIPSE 6.5 STRL STRAW (GLOVE) ×9 IMPLANT
GLOVE SURG SIGNA 7.5 PF LTX (GLOVE) ×3 IMPLANT
GOWN STRL REUS W/ TWL LRG LVL3 (GOWN DISPOSABLE) ×3 IMPLANT
GOWN STRL REUS W/TWL LRG LVL3 (GOWN DISPOSABLE) ×6
LIQUID BAND (GAUZE/BANDAGES/DRESSINGS) ×3 IMPLANT
NS IRRIG 1000ML POUR BTL (IV SOLUTION) IMPLANT
PACK BASIN DAY SURGERY FS (CUSTOM PROCEDURE TRAY) ×3 IMPLANT
POUCH SPECIMEN RETRIEVAL 10MM (ENDOMECHANICALS) ×3 IMPLANT
SCISSORS LAP 5X35 DISP (ENDOMECHANICALS) ×3 IMPLANT
SET CHOLANGIOGRAPH 5 50 .035 (SET/KITS/TRAYS/PACK) IMPLANT
SET IRRIG TUBING LAPAROSCOPIC (IRRIGATION / IRRIGATOR) ×3 IMPLANT
SLEEVE ENDOPATH XCEL 5M (ENDOMECHANICALS) ×6 IMPLANT
SLEEVE SCD COMPRESS KNEE MED (MISCELLANEOUS) ×3 IMPLANT
SPECIMEN JAR SMALL (MISCELLANEOUS) ×3 IMPLANT
SUT MON AB 4-0 PC3 18 (SUTURE) ×3 IMPLANT
TOWEL OR 17X24 6PK STRL BLUE (TOWEL DISPOSABLE) ×3 IMPLANT
TRAY LAPAROSCOPIC (CUSTOM PROCEDURE TRAY) ×3 IMPLANT
TROCAR XCEL BLUNT TIP 100MML (ENDOMECHANICALS) ×3 IMPLANT
TROCAR XCEL NON-BLD 5MMX100MML (ENDOMECHANICALS) ×3 IMPLANT
TUBE CONNECTING 20'X1/4 (TUBING) ×1
TUBE CONNECTING 20X1/4 (TUBING) ×2 IMPLANT
TUBING INSUFFLATION (TUBING) ×3 IMPLANT

## 2015-08-18 NOTE — Op Note (Signed)

## 2015-08-18 NOTE — Anesthesia Postprocedure Evaluation (Signed)
  Anesthesia Post-op Note  Patient: Nicole Pena  Procedure(s) Performed: Procedure(s): LAPAROSCOPIC CHOLECYSTECTOMY (N/A)  Patient Location: PACU  Anesthesia Type: General   Level of Consciousness: awake, alert  and oriented  Airway and Oxygen Therapy: Patient Spontanous Breathing  Post-op Pain: mild  Post-op Assessment: Post-op Vital signs reviewed  Post-op Vital Signs: Reviewed  Last Vitals:  Filed Vitals:   08/18/15 1530  BP: 109/57  Pulse: 68  Temp:   Resp: 17    Complications: No apparent anesthesia complications

## 2015-08-18 NOTE — Discharge Instructions (Signed)
[facility]: POST OP INSTRUCTIONS Always review your discharge instruction sheet given to you by the facility where your surgery was performed. IF YOU HAVE DISABILITY OR FAMILY LEAVE FORMS, YOU MUST BRING THEM TO THE OFFICE FOR PROCESSING.   DO NOT GIVE THEM TO YOUR DOCTOR.  1. A prescription for pain medication may be given to you upon discharge.  Take your pain medication as prescribed, if needed.  If narcotic pain medicine is not needed, then you may take acetaminophen (Tylenol) or ibuprofen (Advil) as needed. 2. Take your usually prescribed medications unless otherwise directed. 3. If you need a refill on your pain medication, please contact your pharmacy.  They will contact our office to request authorization. Prescriptions will not be filled after 5pm or on week-ends. 4. You should follow a light diet the first few days after arrival home, such as soup and crackers, etc.  Be sure to include lots of fluids daily. 5. Most patients will experience some swelling and bruising in the area of the incisions.  Ice packs will help.  Swelling and bruising can take several days to resolve.  6. It is common to experience some constipation if taking pain medication after surgery.  Increasing fluid intake and taking a stool softener (such as Colace) will usually help or prevent this problem from occurring.  A mild laxative (Milk of Magnesia or Miralax) should be taken according to package instructions if there are no bowel movements after 48 hours. 7. Unless discharge instructions indicate otherwise, you may remove your bandages 24-48 hours after surgery, and you may shower at that time.  You may have steri-strips (small skin tapes) in place directly over the incision.  These strips should be left on the skin for 7-10 days.  If your surgeon used skin glue on the incision, you may shower in 24 hours.  The glue will flake off over the next 2-3 weeks.  Any sutures or  staples will be removed at the office during your follow-up visit. 8. ACTIVITIES:  You may resume regular (light) daily activities beginning the next day--such as daily self-care, walking, climbing stairs--gradually increasing activities as tolerated.  You may have sexual intercourse when it is comfortable.  Refrain from any heavy lifting or straining until approved by your doctor. a. You may drive when you are no longer taking prescription pain medication, you can comfortably wear a seatbelt, and you can safely maneuver your car and apply brakes. b. RETURN TO WORK:  __________________________________________________________ 9. You should see your doctor in the office for a follow-up appointment approximately 2-3 weeks after your surgery.  Make sure that you call for this appointment within a day or two after you arrive home to insure a convenient appointment time. 10. OTHER INSTRUCTIONS: __no lifting over 15 pounds for 2 weeks. 11. ________________________________________________________________________________________________________________________ __________________________________________________________________________________________________________________________ WHEN TO CALL YOUR DOCTOR: 1. Fever over 101.0 2. Inability to urinate 3. Continued bleeding from incision. 4. Increased pain, redness, or drainage from the incision. 5. Increasing abdominal pain  The clinic staff is available to answer your questions during regular business hours.  Please dont hesitate to call and ask to speak to one of the nurses for clinical concerns.  If you have a medical emergency, go to the nearest emergency room or call 911.  A surgeon from [facility] is always on call at the hospital. [address], [place], [place]  [address] ? [address] [address], [place], [place]   [address] [phone])  [phone] ? [phone] ? FAX [phone] Web site: [email or web address]   Post  Anesthesia Home Care Instructions  Activity: Get plenty of rest for the remainder of the day. A responsible adult should stay with you for 24 hours following the procedure.  For the next 24 hours, DO NOT: -Drive a car -Advertising copywriter -Drink alcoholic beverages -Take any medication unless instructed by your physician -Make any legal decisions or sign important papers.  Meals: Start with liquid foods such as gelatin or soup. Progress to regular foods as tolerated. Avoid greasy, spicy, heavy foods. If nausea and/or vomiting occur, drink only clear liquids until the nausea and/or vomiting subsides. Call your physician if vomiting continues.  Special Instructions/Symptoms: Your throat may feel dry or sore from the anesthesia or the breathing tube placed in your throat during surgery. If this causes discomfort, gargle with warm salt water. The discomfort should disappear within 24 hours.  If you had a scopolamine patch placed behind your ear for the management of post- operative nausea and/or vomiting:  1. The medication in the patch is effective for 72 hours, after which it should be removed.  Wrap patch in a tissue and discard in the trash. Wash hands thoroughly with soap and water. 2. You may remove the patch earlier than 72 hours if you experience unpleasant side effects which may include dry mouth, dizziness or visual disturbances. 3. Avoid touching the patch. Wash your hands with soap and water after contact with the patch.

## 2015-08-18 NOTE — Interval H&P Note (Signed)
History and Physical Interval Note:no change in H and P  08/18/2015 11:45 AM  Nicole Pena  has presented today for surgery, with the diagnosis of Biliary Sludge  The various methods of treatment have been discussed with the patient and family. After consideration of risks, benefits and other options for treatment, the patient has consented to  Procedure(s): LAPAROSCOPIC CHOLECYSTECTOMY (N/A) as a surgical intervention .  The patient's history has been reviewed, patient examined, no change in status, stable for surgery.  I have reviewed the patient's chart and labs.  Questions were answered to the patient's satisfaction.     BLACKMAN,DOUGLAS A

## 2015-08-18 NOTE — Anesthesia Preprocedure Evaluation (Signed)
Anesthesia Evaluation  Patient identified by MRN, date of birth, ID band Patient awake    Reviewed: Allergy & Precautions, NPO status , Patient's Chart, lab work & pertinent test results  History of Anesthesia Complications (+) PONV  Airway Mallampati: I  TM Distance: >3 FB Neck ROM: Full    Dental  (+) Teeth Intact, Dental Advisory Given   Pulmonary    breath sounds clear to auscultation       Cardiovascular hypertension, Pt. on medications  Rhythm:Regular Rate:Normal     Neuro/Psych    GI/Hepatic   Endo/Other  Morbid obesity  Renal/GU      Musculoskeletal   Abdominal   Peds  Hematology   Anesthesia Other Findings   Reproductive/Obstetrics                             Anesthesia Physical Anesthesia Plan  ASA: III  Anesthesia Plan: General   Post-op Pain Management:    Induction: Intravenous  Airway Management Planned: Oral ETT  Additional Equipment:   Intra-op Plan:   Post-operative Plan: Extubation in OR  Informed Consent: I have reviewed the patients History and Physical, chart, labs and discussed the procedure including the risks, benefits and alternatives for the proposed anesthesia with the patient or authorized representative who has indicated his/her understanding and acceptance.   Dental advisory given  Plan Discussed with: CRNA, Anesthesiologist and Surgeon  Anesthesia Plan Comments:         Anesthesia Quick Evaluation

## 2015-08-18 NOTE — Transfer of Care (Signed)
Immediate Anesthesia Transfer of Care Note  Patient: Nicole Pena  Procedure(s) Performed: Procedure(s): LAPAROSCOPIC CHOLECYSTECTOMY (N/A)  Patient Location: PACU  Anesthesia Type:General  Level of Consciousness: awake and alert   Airway & Oxygen Therapy: Patient Spontanous Breathing and Patient connected to face mask oxygen  Post-op Assessment: Report given to RN and Post -op Vital signs reviewed and stable  Post vital signs: Reviewed and stable  Last Vitals:  Filed Vitals:   08/18/15 1206  BP: 126/76  Pulse: 83  Temp: 36.8 C  Resp: 20    Complications: No apparent anesthesia complications

## 2015-08-18 NOTE — Anesthesia Procedure Notes (Signed)
Procedure Name: Intubation Date/Time: 08/18/2015 1:46 PM Performed by: Caren Macadam Pre-anesthesia Checklist: Patient identified, Emergency Drugs available, Suction available and Patient being monitored Patient Re-evaluated:Patient Re-evaluated prior to inductionOxygen Delivery Method: Circle System Utilized Preoxygenation: Pre-oxygenation with 100% oxygen Intubation Type: IV induction Ventilation: Mask ventilation without difficulty Laryngoscope Size: Miller and 2 Grade View: Grade I Tube type: Oral Number of attempts: 1 Airway Equipment and Method: Stylet and Oral airway Placement Confirmation: ETT inserted through vocal cords under direct vision,  positive ETCO2 and breath sounds checked- equal and bilateral Secured at: 22 cm Tube secured with: Tape Dental Injury: Teeth and Oropharynx as per pre-operative assessment

## 2015-08-19 ENCOUNTER — Encounter (HOSPITAL_BASED_OUTPATIENT_CLINIC_OR_DEPARTMENT_OTHER): Payer: Self-pay | Admitting: Surgery

## 2015-08-19 ENCOUNTER — Encounter: Payer: Self-pay | Admitting: Family Medicine

## 2015-08-27 ENCOUNTER — Ambulatory Visit: Payer: BLUE CROSS/BLUE SHIELD | Admitting: Gastroenterology

## 2015-10-29 ENCOUNTER — Telehealth: Payer: Self-pay | Admitting: Family Medicine

## 2016-02-15 ENCOUNTER — Telehealth: Payer: Self-pay | Admitting: Family Medicine

## 2016-10-22 IMAGING — CR DG CHEST 2V
2 series · 2 of 2 positions shown · non-contrast
Comparison: None.

CLINICAL DATA: Lateral chest pain

EXAM:
CHEST  2 VIEW

[w chest pa]
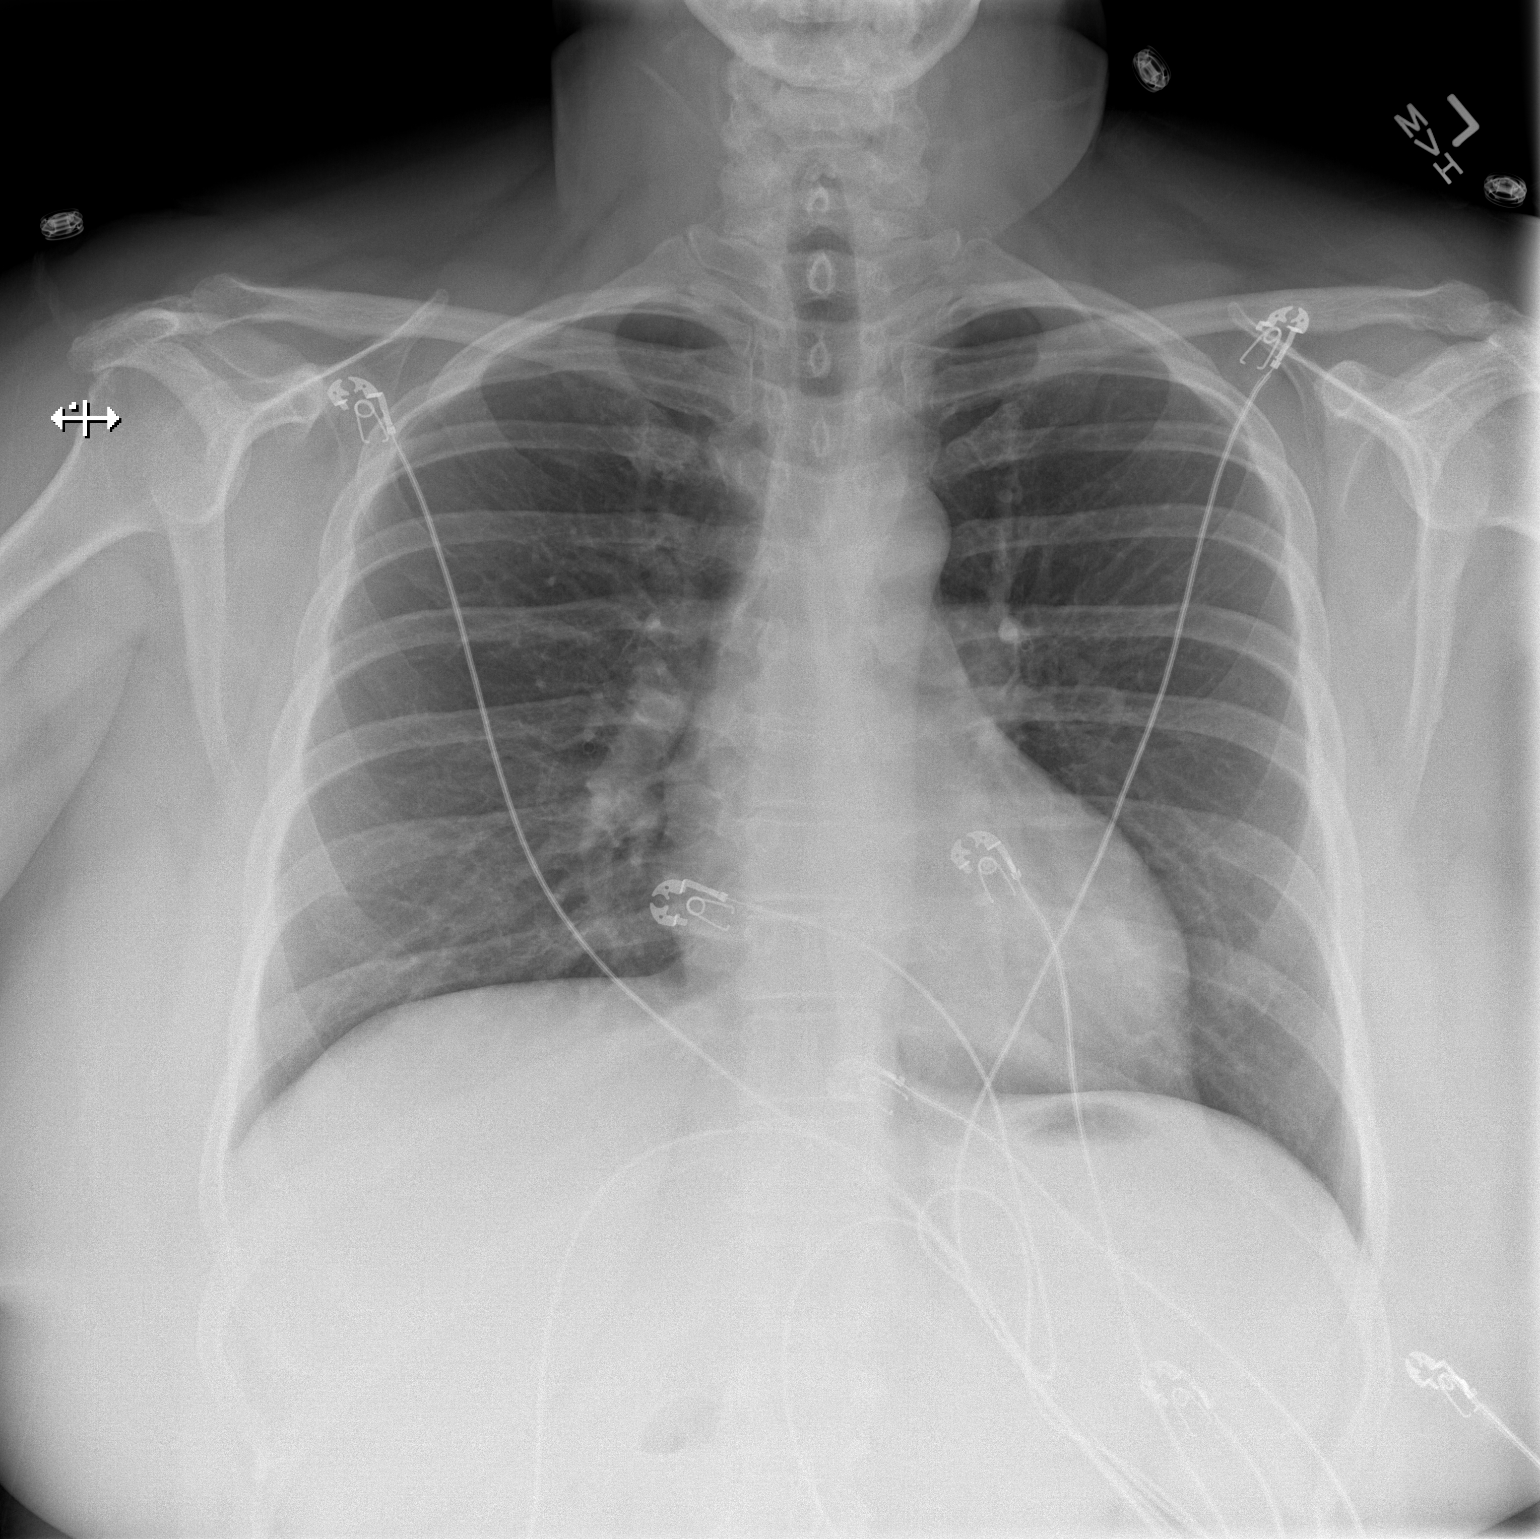

[w chest lat]
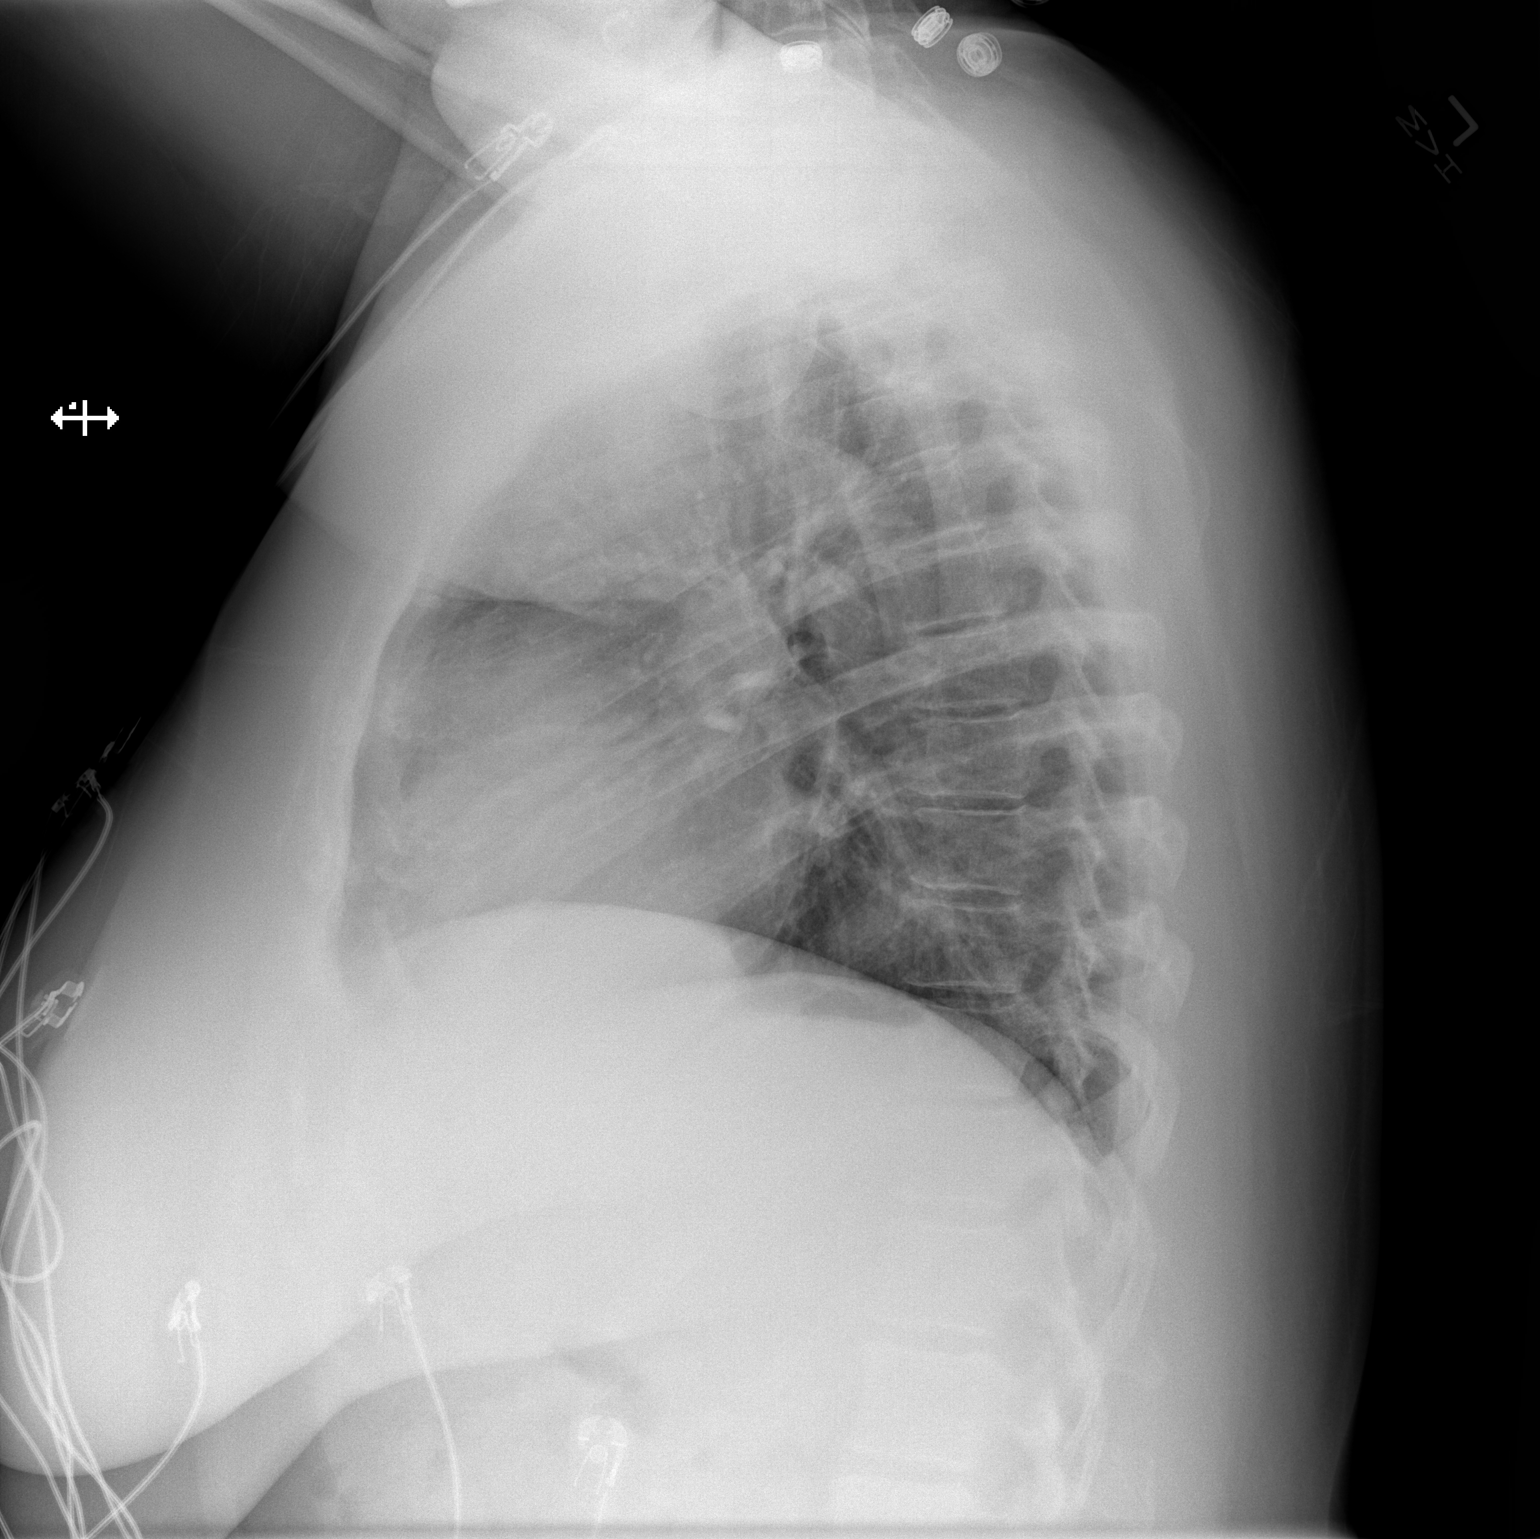

[2 of 2 positions shown; findings below may reference images not displayed]

FINDINGS: The heart size and mediastinal contours are within normal limits.
Both lungs are clear. The visualized skeletal structures are
unremarkable.
IMPRESSION: No active cardiopulmonary disease.

## 2016-10-29 IMAGING — CT CT ABD-PELV W/ CM
2 of 4 series · 17 of 46 positions shown, 19 images · IV contrast (Omnipaque 300)
Comparison: 01/15/2015

CLINICAL DATA: Diffuse abdominal pain for 2 weeks.

EXAM:
CT ABDOMEN AND PELVIS WITH CONTRAST
TECHNIQUE: Multidetector CT imaging of the abdomen and pelvis was performed
using the standard protocol following bolus administration of
intravenous contrast.
CONTRAST:  100mL OMNIPAQUE IOHEXOL 300 MG/ML  SOLN

[Series 2: abd_pel_with 5.0 b40f · axial · 0.79mm/px · z∈[-480,-70]mm · 14 of 92 slices shown, 16 images]
[im 5/92  soft-tissue]
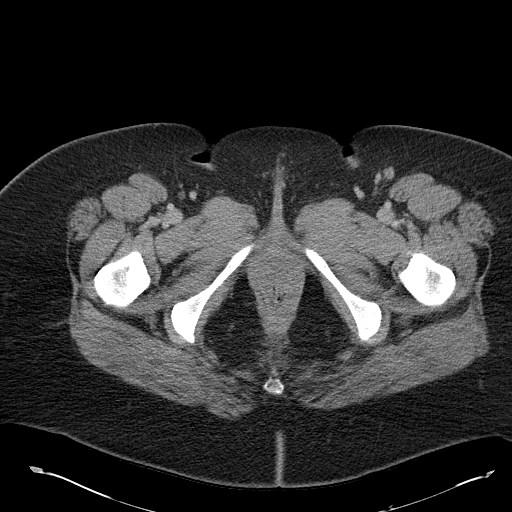
[im 5/92  bone]
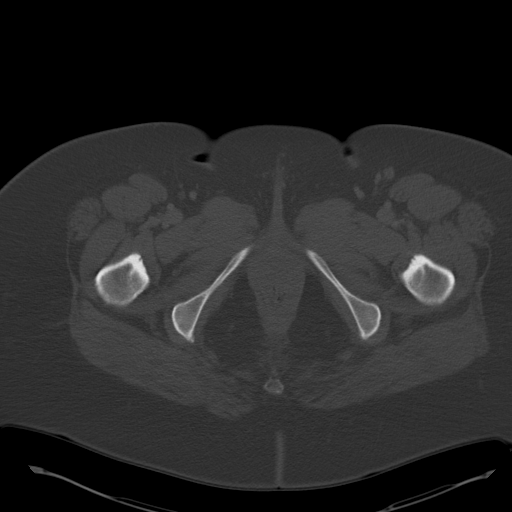
[im 13/92  soft-tissue]
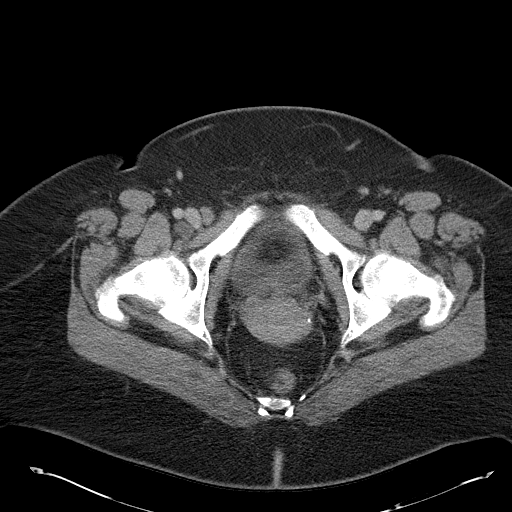
[im 17/92  soft-tissue]
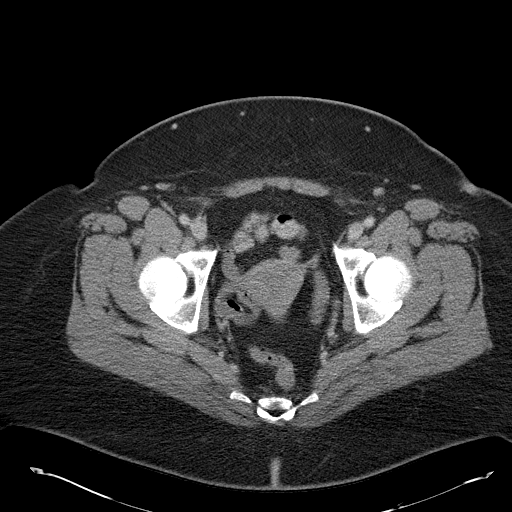
[im 25/92  soft-tissue]
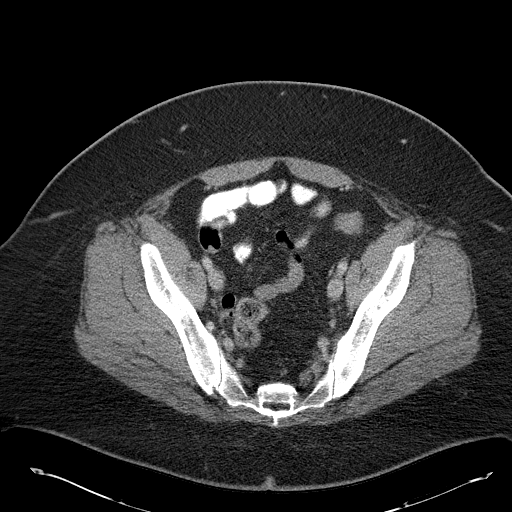
[im 29/92  soft-tissue]
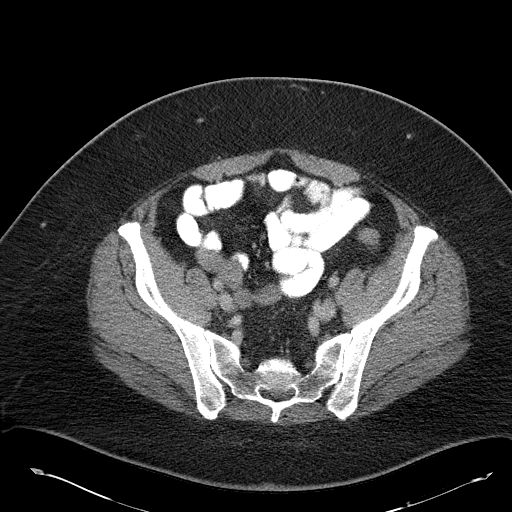
[im 38/92  soft-tissue]
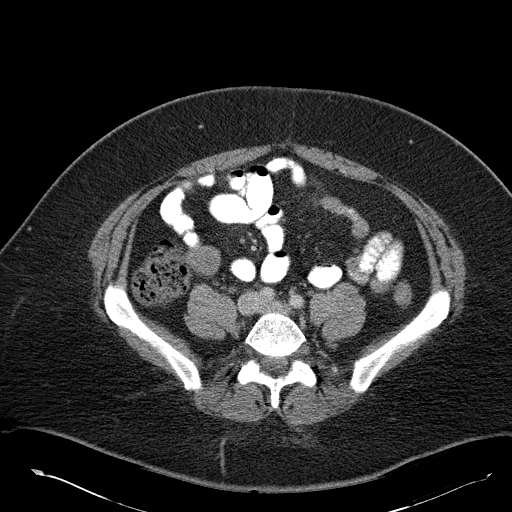
[im 42/92  soft-tissue]
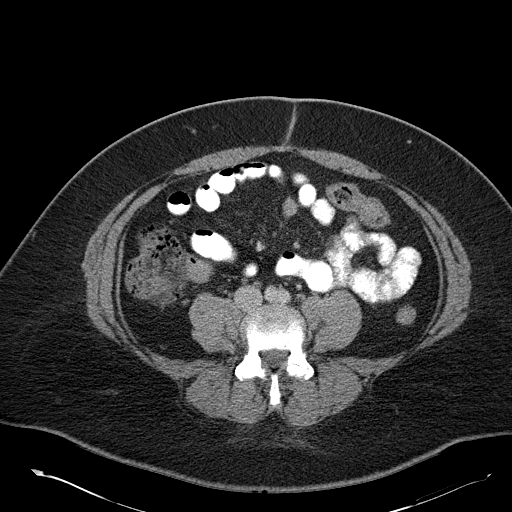
[im 50/92  soft-tissue]
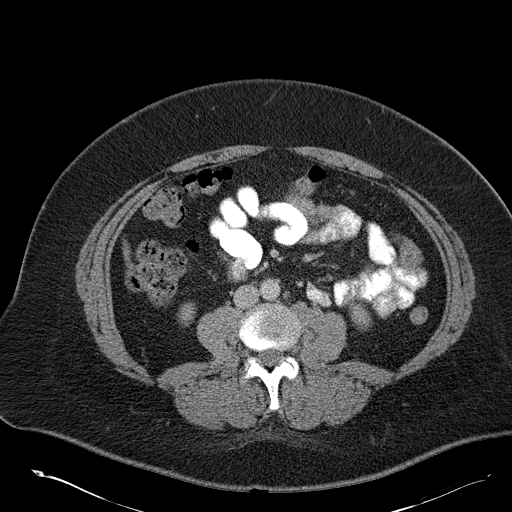
[im 54/92  soft-tissue]
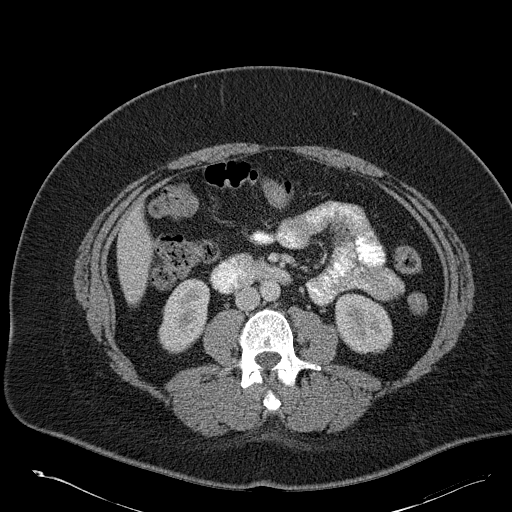
[im 54/92  bone]
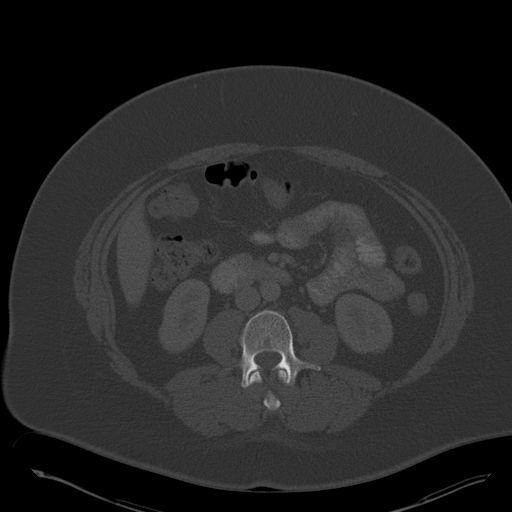
[im 63/92  soft-tissue]
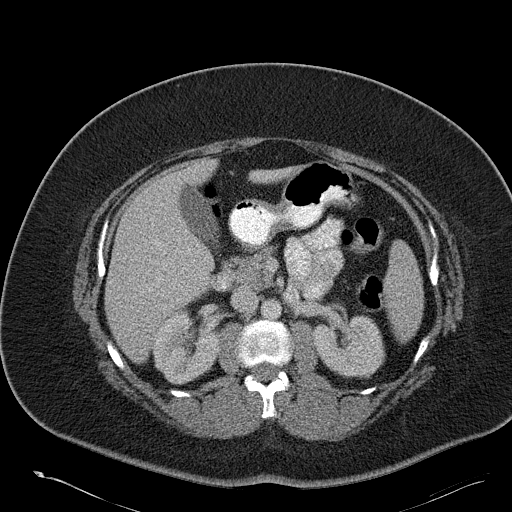
[im 67/92  soft-tissue]
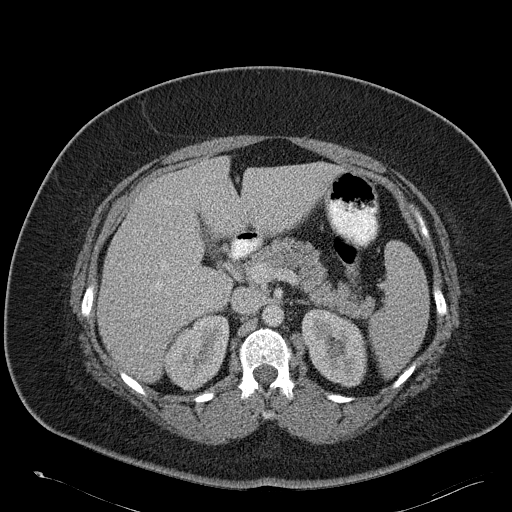
[im 75/92  soft-tissue]
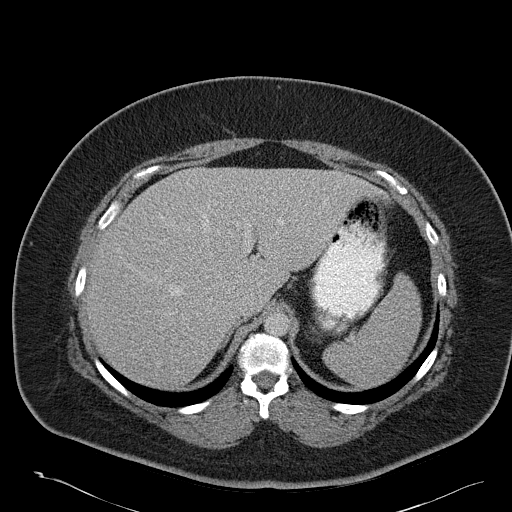
[im 79/92  soft-tissue]
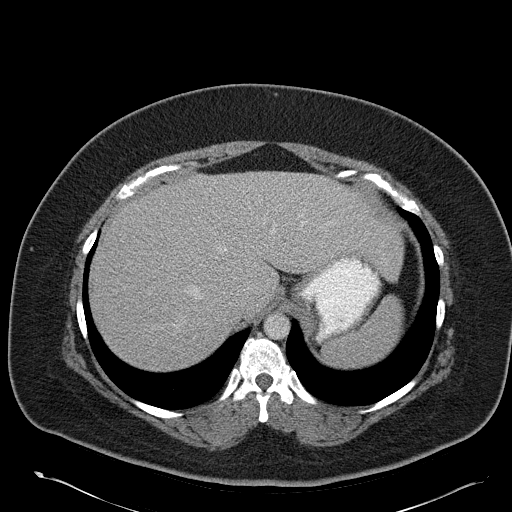
[im 87/92  soft-tissue]
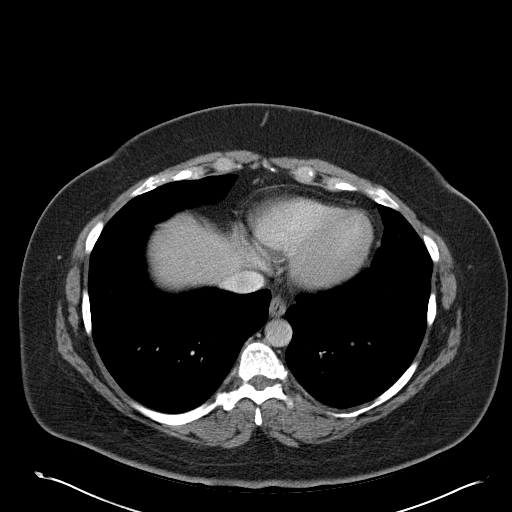

[Series 3: abd_pel_with 3.0 spo cor · coronal · 0.81mm/px · 3 of 89 slices shown]
[im 30/89  soft-tissue]
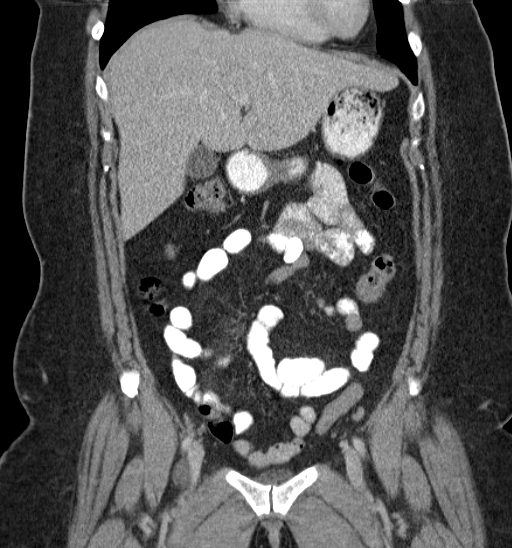
[im 40/89  soft-tissue]
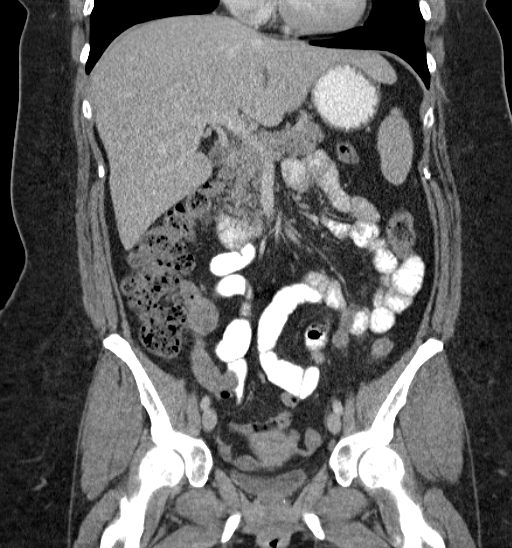
[im 49/89  soft-tissue]
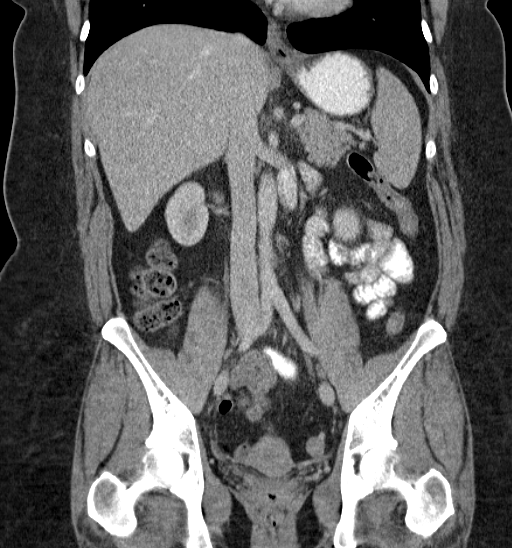

[17 of 46 positions shown; findings below may reference images not displayed]

FINDINGS: Lung bases:  Clear.  Heart normal in size.

Liver, spleen, gallbladder, pancreas, adrenal glands:  Normal.

Kidneys, ureters, bladder:  Normal.

Uterus and adnexa:  Normal.

Lymph nodes: 12 mm low-density lymph node posterior to the right
distal external iliac vein, unchanged. No other adenopathy.

Ascites:  None.

Gastrointestinal: Few sigmoid colon diverticula. Colon otherwise
unremarkable. Normal stomach and small bowel. Normal appendix.

Musculoskeletal: Mild lower lumbar spine disc and facet degenerative
change. No osteoblastic or osteolytic lesions.
IMPRESSION: 1. No acute findings. No findings to explain the patient's abdominal
pain.
2. Few sigmoid colon diverticula.  No diverticulitis.
3. Normal appendix.
4. Mild lower lumbar spine disc and facet degenerative change.
Prominent reactive lymph node the right groin region, stable from
the prior study.
5. No other abnormalities.

## 2018-02-07 NOTE — Procedures (Signed)
Procedure Record - [facility]             Procedure Record - [facility] Summary                                                                Primary Physician:        Susanne Greenhouse    Case Number:              [id number]    Finalized Date/Time:      02/07/18 08:30:48    Pt. Name:                 Julie Gould, Julie Gould    D.O.B./Sex:               1971-04-14    Female    Med Rec #:                [id number]    Physician:                Susanne Greenhouse    Financial #:              [id number]    Pt. Type:                 S    Room/Bed:                 /    Admit/Disch:              02/07/18 06:51:00 -    Institution:       [facility] - Case Times                                                                                                        Entry 1                                                                                                          Patient      In Room Time  02/07/18 07:59:00               Out Room Time                   02/07/18 08:20:00    Anesthesia     Procedure      Start Time               02/07/18 08:12:00               Stop Time                       02/07/18 08:14:00    Last Modified By:         Ivette Loyal RN, KELLY A                              02/07/18 08:29:41      SFEND - Case Times Audit                                                                         02/07/18 08:29:41         Owner: Bonney Leitz                             Modifier: ALEXANDK                                                      <+> 1         Out Room Time     02/07/18 08:14:42         Owner: Bonney Leitz                             Modifier: ALEXANDK                                                      <+> 1         Start Time        <+> 1         Stop Time        SFEND - Case Attendance                                                                                                   Entry 1  Entry 2                          Entry 3                                          Case Attendee             MITCHELL-MD, JR, C              HODGE-CRNA,  JESSICA Kerry Dory    Role Performed            Surgeon Primary                 CRNA                            Endoscopy Technician    Time In                   02/07/18 07:59:00               02/07/18 07:59:00               02/07/18 07:59:00    Time Out     Procedure                 Esophagastroduodenoscopy        Esophagastroduodenoscopy        Esophagastroduodenoscopy                              ,                               ,                               ,                              Esophagastroduodenoscopy        Esophagastroduodenoscopy        Esophagastroduodenoscopy                               with Biopsy / B                 with Biopsy / B                 with Biopsy / B    Last Modified By:         HOCK, RN, KELLY A               HOCK, RN, KELLY A               HOCK, RN, KELLY A  02/07/18 08:17:58               02/07/18 08:17:58               02/07/18 08:17:58                                Entry 4                         Entry 5                                                                          Case Attendee             HOCK, RN, KELLY A               NEAL-MD,  HALLIE LAUREN    Role Performed            Circulator                      Anesthesiologist    Time In                   02/07/18 07:59:00               02/07/18 07:59:00    Time Out     Procedure                 Esophagastroduodenoscopy        Esophagastroduodenoscopy                              ,                               ,                              Esophagastroduodenoscopy        Esophagastroduodenoscopy                               with Biopsy / B                 with Biopsy / B    Last Modified By:         Ivette Loyal, RN, KELLY A               HOCK, RN, KELLY A                              02/07/18 08:17:58                02/07/18 08:17:58      SFEND - Case Attendance Audit  02/07/18 08:17:58         Owner: Bonney Leitz                             Modifier: ALEXANDK                                                          1     <*> Procedure                              Esophagastroduodenoscopy            2     <*> Procedure                              Esophagastroduodenoscopy            3     <+> Time In            3     <*> Procedure                              Esophagastroduodenoscopy            4     <+> Time In            4     <*> Procedure                              Esophagastroduodenoscopy            5     <+> Time In            5     <*> Procedure                              Esophagastroduodenoscopy     02/07/18 08:17:32         Owner: Bonney Leitz                             Modifier: ALEXANDK                                                          1     <*> Procedure                              Esophagastroduodenoscopy            2     <+> Time In            2     <*> Procedure                              Esophagastroduodenoscopy        <+>  3         Case Attendee        <+> 3         Role Performed        <+> 3         Procedure        <+> 4         Case Attendee        <+> 4         Role Performed        <+> 4         Procedure        <+> 5         Case Attendee        <+> 5         Role Performed        <+> 5         Procedure     02/07/18 08:14:35         Owner: Bonney Leitz                             Modifier: ALEXANDK                                                          1     <+> Time In            1     <*> Procedure                              Esophagastroduodenoscopy        <+> 2         Case Attendee        <+> 2         Role Performed        <+> 2         Procedure        SFEND - Patient Positioning                                                                     Pre-Care Text:            A.240 Assesses baseline skin condition A.280  Identifies baseline musculoskeletal status A.280.1 Identifies           physical alterations that require additional precautions for procedure-specific positioning A.510.8 Maintains           patient's dignity and privacy Im.120 Implements protective measures to prevent skin/tissue injury due to           mechanical sources Im.40 Positions the patient Im.80 Applies safety devices                              Entry 1  Procedure                 Esophagastroduodenoscopy        Body Position                   Lateral Right Up                              ,                              Esophagastroduodenoscopy                               with Biopsy / B    Left Arm Position         Resting at Side                 Right Arm Position              Resting at Side    Left Leg Position         Extended                        Right Leg Position              Extended    Feet Uncrossed            Yes                             Pressure Points                 Yes                                                              Checked     Positioning Device        Foam Wedge                      Positioned By                   HODGE-CRNA,  JESSICA N    Outcome Met (O.80)        Yes    Last Modified By:         HOCK, RN, KELLY A                              02/07/18 08:17:59    Post-Care Text:            E.10 Evaluates for signs and symptoms of physical injury to skin and tissue E.290 Evaluates musculoskeletal           status O.80 Patient is free from signs and symptoms of injury related to positioning O.120 the patient is free           from signs and symptoms of injury related to transfer/transport  O.250 Patient's musculoskeletal status is           maintained at or improved from baseline  levels      SFEND - Patient Positioning Audit                                                                02/07/18 08:17:59          Owner: Bonney Leitz                             Modifier: ALEXANDK                                                          1     <*> Procedure                              Esophagastroduodenoscopy        SFEND - Skin Assessment                                                                         Pre-Care Text:            A.240 Assesses baseline skin condition Im.120 Implements protective measures to prevent skin or tissue injury           due to mechanical sources  Im.280.1 Implements progective measures to prevent skin or tissue injury due to           thermal sources Im.360 Monitors for signs and symptons of infection                              Entry 1                                                                                                          Skin Integrity            Intact    Last Modified By:         HOCK, RN, KELLY A                              02/07/18 08:18:17    Post-Care Text:            E.10 Evaluates for signs and symptoms of physical injury to skin and tissue E.270 Evaluate tissue  perfusion           O.60 Patient is free from signs and symptoms of injury caused by extraneous objects   O.210 Patinet's tissue           perfusion is consistent with or improved from baseline levels      SFEND - Time Out - Procedure                                                                    Pre-Care Text:            A.10 Confirms patient identity A.20 Verifies operative procedure, surgical site, and laterality A.20.1 Verifies           consent for planned procedure A.30 Verifies allergies                              Entry 1                                                                                                          Procedure                 Esophagastroduodenoscopy        Patient name and                Yes                              ,                               DOB confirmed                               Esophagastroduodenoscopy                               with Biopsy / B     Surgical procedure        Yes                             Correct surgical                No    to be performed                                           site marked and     confirmed and  initials are     verified by                                               visible through     completed surgical                                        prepped and draped     consent                                                   field (or                                                               alternative ID band                                                               used), if applicable     Allergies discussed       Yes                             Anticoagulation                 Yes                                                              status confirmed     Time Out Complete         02/07/18 08:09:00    Last Modified By:         HOCK, RN, KELLY A                              02/07/18 08:18:00    Post-Care Text:            E.30 Evaluates verification process for correct patient, site, side, and level surgery      SFEND - Time Out - Procedure Audit                                                               02/07/18 08:18:00         Owner: Bonney Leitz  Modifier: ALEXANDK                                                          1     <*> Procedure                              Esophagastroduodenoscopy        SFEND - Debrief - Procedure                                                                     Pre-Care Text:            Im.330 Manages specimen handling and dispositio                              Entry 1                                                                                                          Procedure                 Esophagastroduodenoscopy        Actual procedure                Yes                              ,                               performed confirmed                               Esophagastroduodenoscopy                                with Biopsy / B    Confirm specimens         Yes                             Patient recovery                Yes    and specimens  plan confirmed     labeled     appropriately (if     applicable)     Debrief Complete          02/07/18 08:18:00    Last Modified By:         HOCK, RN, KELLY A                              02/07/18 08:18:11    Post-Care Text:            E.800 Ensures continuity of care E.50 Evaluates results of the surgical count O.30 Patient's procedure is           performed on the correct site, side, and level O.50 patient's current status is communicated throughout the           continuum of care O.40 Patient's specimen(s) is managed in the appropriate manner      SFEND - General Case Data                                                                                                 Entry 1                                                                                                          Case Information      ASA Class                3                               Case Level                      Level 1     OR                       [facility] 04                      Specialty                       Gastroenterology (SN)     Wound Class              No Incision    Preop Diagnosis           HEARTBURN                       Postop Diagnosis  Heartburn    Last Modified By:         Ivette Loyal, RN, KELLY A                              02/07/18 08:30:47      SFEND - General Case Data Audit                                                                  02/07/18 08:30:47         Owner: Bonney Leitz                             Modifier: ALEXANDK                                                          1     <*> OR                                     [facility] End[facility] 01        SFEND - Procedures                                                                                                        Entry 1                         Entry 2                                                                           Procedure     Description      Procedure                Esophagastroduodenoscopy        Esophagastroduodenoscopy                                                               with Biopsy / Brush     Modifiers      Surgical Procedure  EGD                             EGD     Text     Primary Procedure         Yes                             No    Primary Surgeon           Vincent Gros              Susanne Greenhouse                         [facility]    Start                     02/07/18 08:12:00               02/07/18 08:12:00    Stop                      02/07/18 08:14:00               02/07/18 08:14:00    Anesthesia Type           Monitored Anesthesia            Monitored Anesthesia                              Care                            Care    Surgical Service          Gastroenterology (SN)           Gastroenterology (SN)    Wound Class               No Incision                     No Incision    Last Modified By:         Ivette Loyal, RN, KELLY A               HOCK, RN, KELLY A                              02/07/18 08:17:34               02/07/18 08:17:51      SFEND - Procedures Audit                                                                         02/07/18 08:17:51         Owner: Bonney Leitz  Modifier: ALEXANDK                                                      <+> 2         Procedure        <+> 2         Primary Procedure        <+> 2         Primary Surgeon        <+> 2         Surgical Service        <+> 2         Start        <+> 2         Stop        <+> 2         Wound Class        <+> 2         Anesthesia Type        <+> 2         Surgical Procedure Text        SFEND - Transfer                                                                                                          Entry 1                                                                                                           Transferred By            HODGE-CRNA,  JESSICA N,         Via                             Stretcher                              HOCK, RN, KELLY A    Post-op Destination       ACU    Skin Assessment      Condition                Intact    Last Modified By:         HOCK, RN, KELLY A  02/07/18 08:18:43      SFEND - Specimens                                                                               Pre-Care Text:            A.20 Verifies operative procedure, surgical site, and laterality Im.320 Manages culture specimen collection           Im.330 Manages specimen handling and disposition                              Entry 1                                                                                                          Description               A. Antral bx    Last Modified By:         HOCK, RN, KELLY A                              02/07/18 08:18:25    Post-Care Text:            E.40 Evaluates correct processes have been performed for specimen handling and disposition O.40 Patient's           specimen(s) is managed in the appropriate manner      Case Comments                                                                                         <None>              Finalized By: Ivette Loyal, RN, KELLY A      Document Signatures                                                                             Signed By:           Ivette Loyal RN, KELLY A 02/07/18 08:29  HOCK, RN, KELLY A 02/07/18 08:30      Unfinalized History                                                                                     Date/Time            Username    Reason for Unfinalizing         Freetext Reason for Unfinalizing                                          02/07/18 08:30       [facility]    Finish Documentation
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
## 2018-02-07 NOTE — Discharge Summary (Signed)
 Inpatient Clinical Summary             [facility]  Post-Acute Care Transfer Instructions  PERSON INFORMATION   Name: Julie Gould, Julie Gould  MRN: [id number]    FIN#: [id number]   PHYSICIANS  Admitting Physician: LAVELLA MICKEY JAYSON VINIE  Attending Physician: LAVELLA MICKEY JAYSON VINIE   PCP: DORISANN HERON SQUIBB  Discharge Diagnosis:  Heartburn  Comment:       PATIENT EDUCATION INFORMATION  Instructions:             Anesthesia: After Your Surgery-short form (Custom); GI Services Post Procedure Discharge Instruction (12/17) (Custom) (Custom)  Medication Leaflets:               Follow-up:                            With: Address: When:   LAVELLA MICKEY JAYSON VINIE [address]. [place], [place] [address]  [phone]    Comments:   My office will call with path report if (+) for H. Pylori. Resume pre-op diet today. Patient will return to the office as scheduled in preparation for bariatric surgery in the near future.                                      Type Location Start [place]   [place] Tomo Screening Mammogram [facility] Mammo 02/11/2018 15:00:00 02/11/2018 15:10:00 Confirmed          MEDICATION LIST  Medication Reconciliation at Discharge:           Medications that have not changed  Other Medications  buPROPion (buPROPion 150 mg/24 hours (XL) oral tablet, extended release) 1 Tabs Oral (given by mouth) once a day (in the evening).  Last Dose:____________________  cetirizine (ZyrTEC 10 mg oral tablet) 1 Tabs Oral (given by mouth) once a day (in the evening).  Last Dose:____________________  fluticasone nasal (Flonase 50 mcg/inh nasal spray) 1 Sprays Nasal (into the nose) once a day (in the evening).  Last Dose:____________________  losartan-hydrochlorothiazide (losartan-hydrochlorothiazide 50 mg-12.5 mg oral tablet) 1 Tabs Oral (given by mouth) every day.  Last Dose:____________________         Patient's Final Home Medication List Upon Discharge:            buPROPion (buPROPion 150 mg/24  hours (XL) oral tablet, extended release) 1 Tabs Oral (given by mouth) once a day (in the evening).  cetirizine (ZyrTEC 10 mg oral tablet) 1 Tabs Oral (given by mouth) once a day (in the evening).  fluticasone nasal (Flonase 50 mcg/inh nasal spray) 1 Sprays Nasal (into the nose) once a day (in the evening).  losartan-hydrochlorothiazide (losartan-hydrochlorothiazide 50 mg-12.5 mg oral tablet) 1 Tabs Oral (given by mouth) every day.         Comment:       ORDERS           Order Name Order Details   Discharge Patient 02/07/18 8:20:00 EDT, Discharge Home/Self Care
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
## 2019-04-09 ENCOUNTER — Other Ambulatory Visit: Payer: Self-pay | Admitting: *Deleted

## 2023-02-01 ENCOUNTER — Ambulatory Visit: Admit: 2023-02-01 | Discharge: 2023-02-01 | Payer: PRIVATE HEALTH INSURANCE | Attending: Orthopaedic Surgery

## 2023-02-01 ENCOUNTER — Ambulatory Visit: Admit: 2023-02-01 | Discharge: 2023-02-01 | Payer: PRIVATE HEALTH INSURANCE

## 2023-02-01 DIAGNOSIS — M778 Other enthesopathies, not elsewhere classified: Secondary | ICD-10-CM

## 2023-02-01 MED ORDER — METHYLPREDNISOLONE ACETATE 40 MG/ML IJ SUSP
40 | Freq: Once | INTRAMUSCULAR | Status: AC
Start: 2023-02-01 — End: 2023-02-01
  Administered 2023-02-01: 15:00:00 40 mg via INTRA_ARTICULAR

## 2023-02-01 MED ORDER — BUPIVACAINE HCL 0.5 % IJ SOLN
0.5 | Freq: Once | INTRAMUSCULAR | Status: AC
Start: 2023-02-01 — End: 2023-02-01
  Administered 2023-02-01: 15:00:00 150 mL via INTRADERMAL

## 2023-02-01 MED ORDER — LIDOCAINE HCL 1 % IJ SOLN
1 | Freq: Once | INTRAMUSCULAR | Status: AC
Start: 2023-02-01 — End: 2023-02-01
  Administered 2023-02-01: 15:00:00 0.5 mL via INTRADERMAL

## 2023-02-01 NOTE — Progress Notes (Signed)
 Department of Orthopedic Surgery  History and Physical      CHIEF COMPLAINT:  left wrist pain, new patient    HISTORY OF PRESENT ILLNESS:                Julie Gould is a 52 year old female new patient.  She presents with left wrist pain.  She states that this started in November of last year.  She denies any injury to her left wrist.  She believes her pain started after doing physical therapy for her knee surgery.  She reports her pain is localized to her ulnar-sided wrist.  She reports her pain is worse with doing activities such as twisting and with lifting.  She denies numbness in her hand.  She has tried wearing a wrist brace as well as activity modification.  She uses ergonomic keyboard and mouse. She reports pain at nighttime. She describes her pain as splintering. She reports occasional snapping with forearm rotation.  Of note, she reports she had a left dorsal wrist ganglion cyst excision 30 years ago.     Past Medical History:    No past medical history on file.  Past Surgical History:    No past surgical history on file.  Current Medications:   No current facility-administered medications for this visit.  Allergies:  Levonorgestrel and Lisinopril    Social History:   TOBACCO:   has no history on file for tobacco use.  ETOH:   has no history on file for alcohol use.  DRUGS:   has no history on file for drug use.  ACTIVITIES OF DAILY LIVING:    OCCUPATION:    Family History:   No family history on file.      PHYSICAL EXAM:    GENERAL APPEARANCE: well developed, well nourished, Patient is alert and oriented X 3. No acute distress.  RESPIRATORY: regular, unlabored, no signs of cyanosis.   SKIN: good turgor, no rashes noted, warm and dry.   PSYCH: alert, oriented, cognitive function intact, judgment and insight good, mood/affect appropriate.     LUE:  Well-healed incision over the dorsal wrist, erythema or warmth  Mild swelling in the ulnar sided wrist, no erythema, on warmth   she has tenderness to palpation  over the distal ulnar fovea  Pain with ulnar grind maneuver  Mild tenderness to palpation over the ECU  Mildly positive ECU Synergy test  Full forearm rotation without subluxation of the ECU  Full wrist range of motion   Neurovascular intact      Imaging:  XR WRIST LEFT (MIN 3 VIEWS)    Result Date: 02/01/2023  AP/lateral/oblique left wrist: There is no evidence of a fracture or dislocation. No bone or joint abnormality is appreciated. No soft tissue abnormalities are noted.        Orders:    Orders Placed This Encounter    XR WRIST LEFT (MIN 3 VIEWS)    PR INJECTION SINGLE TENDON ORIGIN/INSERTION     After obtaining informed consent, using sterile technique 2 cc of 1% lidocaine  was used to place a 25 gauge needle into the left wrist sixth dorsal wrist compartment.  1 cc of Depo-Medrol  and 1 cc of 0.5% Marcaine  was injected.    Cetirizine HCl (ZYRTEC ALLERGY) 10 MG CAPS     Sig: Take 1 tablet by mouth    BUPivacaine  (MARCAINE ) 0.5 % injection 150 mg    methylPREDNISolone  acetate (DEPO-MEDROL ) injection 40 mg    lidocaine  1 % injection 0.5 mL  Assessment:       Encounter Diagnosis   Name Primary?    Left wrist tendonitis Yes         PLAN:    The patient reports a 3 to 64-month history of ulnar-sided left wrist pain.  X-rays of her left wrist are unremarkable for any bone or joint abnormalities.  On exam, she has pain along the ulnar side of the wrist most likely secondary to ECU tendinitis.  I recommend a steroid injection.  She tolerated the injection with difficulties.  If her symptoms do not improve, she will return for reevaluation.  We discussed the possibility of MRI if her symptoms persist.

## 2023-02-28 NOTE — Progress Notes (Signed)
Department of Orthopedic Surgery  History and Physical      CHIEF COMPLAINT: Left wrist tendinitis follow-up    HISTORY OF PRESENT ILLNESS:                Julie Gould is here for follow-up of left wrist ECU tendinitis.  Last visit on 02/01/2023 she received a left ECU tendinitis steroid injection.  She reports the steroid injection helped with her left wrist pain.  She reports a 2 out of 10 pain in her left wrist today.  She states her pain is mainly localized to her ulnar-sided wrist.    She is now presenting with right wrist pain.  She states that this pain started after her knee surgery, 5 months ago.  She denies any injury, but reports she was having to lift herself out of chairs frequently. 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 It is a lot of repetitive use.     Past Medical History:    No past medical history on file.  Past Surgical History:    No past surgical history on file.  Current Medications:   No current facility-administered medications for this visit.  Allergies:  Levonorgestrel and Lisinopril    Social History:   TOBACCO:   has no history on file for tobacco use.  ETOH:   has no history on file for alcohol use.  DRUGS:   has no history on file for drug use.  ACTIVITIES OF DAILY LIVING:    OCCUPATION:    Family History:   No family history on file.      PHYSICAL EXAM:    GENERAL APPEARANCE: well developed, well nourished, Patient is alert and oriented X 3. No acute distress.  RESPIRATORY: regular, unlabored, no signs of cyanosis.   SKIN: good turgor, no rashes noted, warm and dry.   PSYCH: alert, oriented, cognitive function intact, judgment and insight good, mood/affect appropriate.     RUE:  Well-healed incision over the dorsal wrist, erythema or warmth  No Pain with ulnar grind maneuver  Tenderness to palpation over the ECU  No TTP over the distal ulnar fovea  positive ECU Synergy test  Full forearm rotation without subluxation of the ECU  Full wrist range of motion   Neurovascular  intact    LUE:  Well-healed incision over the dorsal wrist, erythema or warmth  No Pain with ulnar grind maneuver  Mild tenderness to palpation over the ECU  Full forearm rotation without subluxation of the ECU  Full wrist range of motion   Neurovascular intact    Imaging:  XR WRIST RIGHT (MIN 3 VIEWS)    Result Date: 03/01/2023  AP/lateral/oblique right wrist: There is no evidence of a fracture or dislocation. No bone or joint abnormality is appreciated. No soft tissue abnormalities are noted.        Orders:  Orders Placed This Encounter    XR WRIST RIGHT (MIN 3 VIEWS)    PR INJECTION SINGLE TENDON ORIGIN/INSERTION     After obtaining informed consent, using sterile technique 2 cc of 1% lidocaine was used to place a 25 gauge needle into the right wrist 6th dorsal wrist compartment.  1 cc of Depo-Medrol and 1 cc of 0.5% Marcaine was injected.    methylPREDNISolone acetate (DEPO-MEDROL) injection 40 mg    lidocaine 1 % injection 0.5 mL    BUPivacaine (MARCAINE) 0.5 % injection 150 mg         Assessment:      Encounter Diagnoses  Name Primary?    Left wrist tendonitis Yes    Right wrist tendonitis          PLAN:  The patient reports that her left wrist pain has improved significantly since receiving a steroid injection for ECU tendinitis.  She states that she is also experiencing pain in her right wrist.  X-rays of her right wrist are unremarkable.  Her exam is consistent with ECU tendinitis.  She requests a steroid injection today.  She tolerated the injection without difficulties.  She can follow-up as needed.

## 2023-03-01 ENCOUNTER — Ambulatory Visit: Admit: 2023-03-01 | Discharge: 2023-03-01 | Payer: PRIVATE HEALTH INSURANCE

## 2023-03-01 ENCOUNTER — Encounter: Admit: 2023-03-01 | Discharge: 2023-03-01 | Payer: PRIVATE HEALTH INSURANCE | Attending: Orthopaedic Surgery

## 2023-03-01 DIAGNOSIS — M778 Other enthesopathies, not elsewhere classified: Secondary | ICD-10-CM

## 2023-03-01 MED ORDER — BUPIVACAINE HCL 0.5 % IJ SOLN
0.5 | Freq: Once | INTRAMUSCULAR | Status: AC
Start: 2023-03-01 — End: 2023-03-01
  Administered 2023-03-01: 14:00:00 150 mL via INTRADERMAL

## 2023-03-01 MED ORDER — LIDOCAINE HCL 1 % IJ SOLN
1 | Freq: Once | INTRAMUSCULAR | Status: AC
Start: 2023-03-01 — End: 2023-03-01
  Administered 2023-03-01: 14:00:00 0.5 mL via INTRADERMAL

## 2023-03-01 MED ORDER — METHYLPREDNISOLONE ACETATE 40 MG/ML IJ SUSP
40 | Freq: Once | INTRAMUSCULAR | Status: AC
Start: 2023-03-01 — End: 2023-03-01
  Administered 2023-03-01: 14:00:00 40 mg via INTRA_ARTICULAR

## 2024-09-18 ENCOUNTER — Ambulatory Visit: Admit: 2024-09-18 | Discharge: 2024-09-18 | Payer: PRIVATE HEALTH INSURANCE | Primary: Diagnostic Radiology

## 2024-09-18 ENCOUNTER — Ambulatory Visit
Admit: 2024-09-18 | Discharge: 2024-09-18 | Payer: PRIVATE HEALTH INSURANCE | Attending: Hand Surgery | Primary: Diagnostic Radiology

## 2024-09-18 MED ORDER — BETAMETHASONE SOD PHOS & ACET 6 (3-3) MG/ML IJ SUSP
6 | Freq: Once | INTRAMUSCULAR | Status: AC
Start: 2024-09-18 — End: 2024-09-18
  Administered 2024-09-18: 20:00:00 6 mg via INTRA_ARTICULAR

## 2024-09-18 MED ORDER — MELOXICAM 15 MG PO TABS
15 | ORAL_TABLET | Freq: Every day | ORAL | 0 refills | Status: AC
Start: 2024-09-18 — End: 2024-10-09

## 2024-09-18 NOTE — Progress Notes (Signed)
 "    Orthopaedic Hand Surgery Note        Name: Julie Gould  Date of Birth: Mar 31, 1971  Gender: female  MRN: [id number]    History of Present Illness  The patient presents for evaluation of wrist pain.    They report experiencing ulnar-sided wrist pain, a condition previously diagnosed as tendinitis and treated with injections. The pain has recently resurfaced, hindering their ability to engage in activities such as online gaming with their sibling and cutting and polishing rocks, both of which involve repetitive wrist motion. They recall an incident from the previous night where they had to discontinue gaming within 5 minutes due to the intensity of the pain. They describe the pain as a dull ache that intensifies upon wrist usage. n. They have attempted to manage the pain with a brace, but found it ineffective. They have tried various treatments including bracing, therapy, and exercises.      History reviewed. No pertinent past medical history.  Past Surgical History:   Procedure Laterality Date    ABDOMEN SURGERY      ANKLE FRACTURE SURGERY  2006    HAND SURGERY  1990    Cyst removed left wrist    KNEE SURGERY  2023    1st 1998, 2nd 2014, 3rd 2023       Physical Exam  In the bilateral upper extremities, there is no soft tissue swelling. Tenderness is noted upon palpation at the fifth dorsal compartment bilaterally. There is no tenderness over the ulnar fovea, DRUJ or elsewhere throughout the hand or wrist. Mild pain is present with wrist motion localized to the fifth dorsal compartment. ECU Synergy test is significantly positive. There is no ECU subluxation.    Imaging/NCS    Wrist XR: AP, Lateral, Oblique views     Clinical Indication:  1. Bilateral wrist pain    2. Tendonitis of both wrists           Report: AP, lateral, oblique x-ray of the bilateral wrist demonstrates no bony abnormalities    Impression: as above     Duwaine BRAVO Rihan Schueler, MD           ICD-10-CM    1. Bilateral wrist pain  M25.531 XR WRIST BILATERAL  MINIMUM 3 VW    M25.532       2. Tendonitis of both wrists  M77.8 meloxicam  (MOBIC ) 15 MG tablet     betamethasone  acetate-betamethasone  sodium phosphate  (CELESTONE ) injection 6 mg     INJECT TENDON SHEATH/LIGAMENT     US  GUIDED NEEDLE PLACEMENT        Assessment & Plan  1. Extensor carpi ulnaris tendinitis.  Symptoms and physical examination findings are consistent with extensor carpi ulnaris tendinitis.  Steroid injections will be administered today to alleviate inflammation and pain. A prescription for meloxicam  has been provided.    Procedure Note    The risk, benefits and alternatives of injection and no injection therapy were discussed. Risks including skin blanching, subcutaneous fat atrophy, and elevations in blood glucose levels were discussed.  The patient consented for an injection. Time out performed. The patient has been identified by name and birthdate. The injection site was identified, marked and prepped with a alcohol swab. The bilateral 6th dorsal extensor compartment was injected with 1ml of 6mg /ml Celestone  1ml of Lidocaine  plain 1%. Ultrasound was used to guide needle placement. The injection site was then dressed with a bandaid. The patient tolerated the injection well. The patient was instructed to  monitor their blood sugars if diabetic and call if any concerns. The patient was instructed to call the office if any adverse local effects occurred or any if any questions or concerns arise.            Duwaine FORBES Benne, MD  Orthopaedic Surgery  09/18/24  5:36 PM      The patient (or guardian, if applicable) and other individuals in attendance with the patient were advised that Artificial Intelligence will be utilized during this visit to record, process the conversation to generate a clinical note, and support improvement of the AI technology. The patient (or guardian, if applicable) and other individuals in attendance at the appointment consented to the use of AI, including the recording.                 "
